# Patient Record
Sex: Female | Born: 1965 | Race: White | Hispanic: No | Marital: Married | Smoking: Former smoker
Health system: Southern US, Community
[De-identification: ages and names within clinical notes are randomized; demographics above are authoritative.]

## PROBLEM LIST (undated history)

## (undated) DIAGNOSIS — M797 Fibromyalgia: Secondary | ICD-10-CM

## (undated) DIAGNOSIS — M4802 Spinal stenosis, cervical region: Secondary | ICD-10-CM

## (undated) HISTORY — PX: BREAST BIOPSY: SHX20

## (undated) HISTORY — PX: OTHER SURGICAL HISTORY: SHX169

## (undated) HISTORY — DX: Fibromyalgia: M79.7

## (undated) HISTORY — DX: Spinal stenosis, cervical region: M48.02

---

## 2008-09-24 ENCOUNTER — Ambulatory Visit: Payer: Self-pay | Admitting: Internal Medicine

## 2008-11-21 ENCOUNTER — Ambulatory Visit: Payer: Self-pay | Admitting: Obstetrics and Gynecology

## 2008-11-23 ENCOUNTER — Ambulatory Visit: Payer: Self-pay | Admitting: Obstetrics and Gynecology

## 2010-08-15 ENCOUNTER — Ambulatory Visit: Payer: Self-pay | Admitting: Unknown Physician Specialty

## 2011-04-03 ENCOUNTER — Ambulatory Visit: Payer: Self-pay | Admitting: Obstetrics and Gynecology

## 2011-06-04 ENCOUNTER — Ambulatory Visit: Payer: Self-pay | Admitting: Internal Medicine

## 2011-06-05 ENCOUNTER — Emergency Department: Payer: Self-pay | Admitting: Emergency Medicine

## 2011-06-05 LAB — BASIC METABOLIC PANEL
Anion Gap: 6 — ABNORMAL LOW (ref 7–16)
Calcium, Total: 8.9 mg/dL (ref 8.5–10.1)
Chloride: 104 mmol/L (ref 98–107)
EGFR (African American): >60
Glucose: 121 mg/dL — ABNORMAL HIGH (ref 65–99)
Sodium: 138 mmol/L (ref 136–145)

## 2011-06-05 LAB — CBC
MCH: 31.9 pg (ref 26.0–34.0)
MCHC: 33.9 g/dL (ref 32.0–36.0)
Platelet: 198 10*3/uL (ref 150–440)
RDW: 13.1 % (ref 11.5–14.5)
WBC: 16 10*3/uL — ABNORMAL HIGH (ref 3.6–11.0)

## 2011-06-05 LAB — TROPONIN I: Troponin-I: <0.02 ng/mL

## 2011-06-26 ENCOUNTER — Ambulatory Visit (INDEPENDENT_AMBULATORY_CARE_PROVIDER_SITE_OTHER): Admitting: Internal Medicine

## 2011-06-26 ENCOUNTER — Encounter: Payer: Self-pay | Admitting: Internal Medicine

## 2011-06-26 VITALS — BP 106/60 | HR 80 | Temp 98.0°F | Resp 16 | Ht 66.5 in | Wt 138.8 lb

## 2011-06-26 DIAGNOSIS — K2990 Gastroduodenitis, unspecified, without bleeding: Secondary | ICD-10-CM

## 2011-06-26 DIAGNOSIS — R5383 Other fatigue: Secondary | ICD-10-CM

## 2011-06-26 DIAGNOSIS — R5381 Other malaise: Secondary | ICD-10-CM

## 2011-06-26 DIAGNOSIS — M4802 Spinal stenosis, cervical region: Secondary | ICD-10-CM

## 2011-06-26 DIAGNOSIS — K2970 Gastritis, unspecified, without bleeding: Secondary | ICD-10-CM

## 2011-06-26 DIAGNOSIS — N393 Stress incontinence (female) (male): Secondary | ICD-10-CM

## 2011-06-26 DIAGNOSIS — M546 Pain in thoracic spine: Secondary | ICD-10-CM

## 2011-06-26 MED ORDER — OMEPRAZOLE 40 MG PO CPDR: 40.0000 mg | DELAYED_RELEASE_CAPSULE | Freq: Every day | ORAL | Status: DC

## 2011-06-26 MED ORDER — TRAMADOL HCL 50 MG PO TABS: 50.0000 mg | ORAL_TABLET | Freq: Three times a day (TID) | ORAL | Status: AC | PRN

## 2011-06-26 MED ORDER — MELOXICAM 15 MG PO TABS: 15.0000 mg | ORAL_TABLET | Freq: Every day | ORAL | Status: AC

## 2011-06-26 NOTE — Progress Notes (Signed)
Patient ID: Debbie Lawrence, female   DOB: March 26, 1965, 46 y.o.   MRN: [id number]  Patient Active Problem List  Diagnoses  . Degenerative cervical spinal stenosis  . Back pain, thoracic  . Stress incontinence  . Fatigue  . Gastritis    Subjective:  CC:   Chief Complaint  Patient presents with  . New Patient    HPI:   Debbie Lawrence a 46 y.o. female who presents  As a new patient here to establish primary care.  She was referred by several friends.  She has a recent history of pneumonia, treated in the ER 2 weeks ago.  History of fibroid adenoma of the right breast which was biopsied after ultrasound in  2008. Found by self exam.   Sister has history of breast cancer attributed to use of oral contraceptives because it was ER positive and underwent, lumpectomy and LN removed.  Last mammogram at [facility] April 2013 at [facility] and was normal.  Her last PAP smear was  April 2011.  No history of abnormals.  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  She has been self referred to a pain specialist.  Her pain is on the right, accompanied by numbness and tingling under the shoulder blades, worse in the AM and at the end of the day . Has not had a thoarcic  MRi but has had a neck MRI.  She has been using NSAIDs for control of pain but has developed  Gastritis symptoms with persistent use of alleve or more than 3 ibuprofen .   Past Medical History  Diagnosis Date  . Degenerative cervical spinal stenosis     occupational, hari dresser    No past surgical history on file.       The following portions of the patient's history were reviewed and updated as appropriate: Allergies, current medications, and problem list.    Review of Systems:   12 Pt  review of systems was negative except those addressed in the HPI,     History   Social History  . Marital Status: Married    Spouse Name: N/A    Number of Children: N/A  . Years of  Education: N/A   Occupational History  . Not on file.   Social History Main Topics  . Smoking status: Former Smoker -- 15 years    Types: Cigarettes    Quit date: 06/12/2011  . Smokeless tobacco: Never Used  . Alcohol Use: No  . Drug Use: No  . Sexually Active: Not on file   Other Topics Concern  . Not on file   Social History Narrative  . No narrative on file    Objective:  BP 106/60  Pulse 80  Temp(Src) 98 F (36.7 C) (Oral)  Resp 16  Ht 5' 6.5" (1.689 m)  Wt 138 lb 12 oz (62.937 kg)  BMI 22.06 kg/m2  SpO2 97%  LMP 06/10/2011  General appearance: alert, cooperative and appears stated age Ears: normal TM's and external ear canals both ears Throat: lips, mucosa, and tongue normal; teeth and gums normal Neck: no adenopathy, no carotid bruit, supple, symmetrical, trachea midline and thyroid not enlarged, symmetric, no tenderness/mass/nodules Back: symmetric, no curvature. ROM normal. No CVA tenderness. Lungs: clear to auscultation bilaterally Heart: regular rate and rhythm, S1, S2 normal, no murmur, click, rub or gallop Abdomen: soft, non-tender; bowel sounds normal; no masses,  no organomegaly Pulses: 2+ and symmetric Skin: Skin color,  texture, turgor normal. No rashes or lesions Lymph nodes: Cervical, supraclavicular, and axillary nodes normal.  Assessment and Plan:  Stress incontinence Kegel exercises.   Back pain, thoracic Her symptoms suggest a hight thoracic disk problem.  She has developed gastritis to NSAIDs.  MRI thoaracic spinen ordered and tramadol trial.   Degenerative cervical spinal stenosis By history with no prior surgical intervention Records requested. neurologic exam is thus far normal.  Fatigue Anemia and thyroid  ruled out by gyn several  months ago,  Has chronic pain, no history of snoring or hypertension to suggest OSA.     Gastritis Suggested by history,  Suspending all NSAIDs for 2 weeks. PPI daily.  Trial of meloxicam after 2  weeks.,  If sympotms return will need to dc all NSAIDS and send for EGD    Updated Medication List Outpatient Encounter Prescriptions as of 06/26/2011  Medication Sig Dispense Refill  . Ascorbic Acid (VITAMIN C) 100 MG tablet Take 100 mg by mouth daily.      Marland Kitchen CRANBERRY EXTRACT PO Take by mouth.      Marland Kitchen FLUoxetine (PROZAC) 10 MG capsule Take 10 mg by mouth daily.      . Prenatal Vit-Fe Fumarate-FA (PRENATAL COMPLETE PO) Take by mouth.      . meloxicam (MOBIC) 15 MG tablet Take 1 tablet (15 mg total) by mouth daily.  30 tablet  2  . omeprazole (PRILOSEC) 40 MG capsule Take 1 capsule (40 mg total) by mouth daily.  30 capsule  3  . traMADol (ULTRAM) 50 MG tablet Take 1 tablet (50 mg total) by mouth every 8 (eight) hours as needed for pain.  30 tablet  1     Orders Placed This Encounter  Procedures  . MR Thoracic Spine Wo Contrast    No Follow-up on file.

## 2011-06-26 NOTE — Assessment & Plan Note (Signed)
Kegel exercises

## 2011-06-26 NOTE — Patient Instructions (Signed)
Suspend all ibuprofen for two weeks while we let your stomach heal, use the tramadol every 6 hours as needed for pain   Take omeprazole 40 mg daily for your stomach.   Start meloxicam in 2 weeks ,  One tablet daily (this replaces ibuprofen as your anti infllamatory)

## 2011-06-30 ENCOUNTER — Encounter: Payer: Self-pay | Admitting: Internal Medicine

## 2011-06-30 DIAGNOSIS — K589 Irritable bowel syndrome without diarrhea: Secondary | ICD-10-CM | POA: Insufficient documentation

## 2011-06-30 NOTE — Assessment & Plan Note (Signed)
By history with no prior surgical intervention Records requested. neurologic exam is thus far normal.

## 2011-06-30 NOTE — Assessment & Plan Note (Signed)
Her symptoms suggest a hight thoracic disk problem.  She has developed gastritis to NSAIDs.  MRI thoaracic spinen ordered and tramadol trial.

## 2011-06-30 NOTE — Assessment & Plan Note (Signed)
Suggested by history,  Suspending all NSAIDs for 2 weeks. PPI daily.  Trial of meloxicam after 2 weeks.,  If sympotms return will need to dc all NSAIDS and send for EGD

## 2011-06-30 NOTE — Assessment & Plan Note (Signed)
Anemia and thyroid  ruled out by gyn several  months ago,  Has chronic pain, no history of snoring or hypertension to suggest OSA.

## 2011-07-01 ENCOUNTER — Ambulatory Visit: Payer: Self-pay | Admitting: Internal Medicine

## 2011-07-05 ENCOUNTER — Telehealth: Payer: Self-pay | Admitting: Internal Medicine

## 2011-07-05 DIAGNOSIS — R160 Hepatomegaly, not elsewhere classified: Secondary | ICD-10-CM | POA: Insufficient documentation

## 2011-07-05 DIAGNOSIS — M546 Pain in thoracic spine: Secondary | ICD-10-CM

## 2011-07-05 NOTE — Telephone Encounter (Signed)
Patient notified of the MRI results.

## 2011-07-05 NOTE — Telephone Encounter (Signed)
Her thoracic spine MRI showed some mild disk disease at multiple levels at T5 -T6 and  T6-T7 which may respond to PT, so I recommend a trial of PT.  The other issue is an incidental finding of a 1.5 cm mass on her liver, and the radiologist is recommending a dedicated MRI to look at it more closely.  I will place the order.

## 2011-07-05 NOTE — Telephone Encounter (Signed)
Left message asking patient to return my call.

## 2011-07-05 NOTE — Assessment & Plan Note (Signed)
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  Will refer to PT

## 2011-07-05 NOTE — Assessment & Plan Note (Signed)
Incidental finding on MRI of thoracic spine, MRI ordered.

## 2011-07-09 ENCOUNTER — Encounter: Payer: Self-pay | Admitting: Internal Medicine

## 2011-07-10 ENCOUNTER — Encounter: Payer: Self-pay | Admitting: Internal Medicine

## 2011-07-11 ENCOUNTER — Telehealth: Payer: Self-pay | Admitting: Internal Medicine

## 2011-07-11 NOTE — Telephone Encounter (Signed)
Pt called to F/U on MRI order that was request from pt on 07/05/11, Please call pt back.

## 2011-07-12 NOTE — Telephone Encounter (Signed)
I have called patient back and see that she does need a MRI of the Abdomen.  I told patient that I would make appointment and get back with her.

## 2011-07-15 ENCOUNTER — Encounter: Payer: Self-pay | Admitting: Internal Medicine

## 2011-07-15 NOTE — Telephone Encounter (Signed)
Patient is scheduled for this on 07/16/11  At 12:30, patient is aware.see referral notes.

## 2011-07-22 ENCOUNTER — Ambulatory Visit: Payer: Self-pay | Admitting: Internal Medicine

## 2011-07-22 ENCOUNTER — Encounter: Payer: Self-pay | Admitting: Internal Medicine

## 2011-07-23 ENCOUNTER — Telehealth: Payer: Self-pay | Admitting: Internal Medicine

## 2011-07-30 ENCOUNTER — Ambulatory Visit: Payer: Self-pay | Admitting: Pain Medicine

## 2011-08-06 ENCOUNTER — Ambulatory Visit: Payer: Self-pay | Admitting: Pain Medicine

## 2011-08-12 ENCOUNTER — Encounter: Payer: Self-pay | Admitting: Internal Medicine

## 2011-08-12 NOTE — Telephone Encounter (Signed)
Opened in error

## 2011-08-20 ENCOUNTER — Telehealth: Payer: Self-pay | Admitting: Internal Medicine

## 2011-08-20 NOTE — Telephone Encounter (Signed)
Caller: Debbie Lawrence/Patient; PCP: Duncan Dull; CB#: [phone]. Call regarding ?sinus Infection; Caller reports cold sxs that began appx 2 weeks ago. Lingering and she now has facial pain and pressure, pain in her left ear, headache and coughing with yellow/greenish mucous. Same from nose. Afebrile. Caller asking if she can get Rx or does she need to be seen. Caller advised she will need to be seen for eval of sxs. Home care per Face Pain or Pressure Protocol, See in 24hrs. Appt scheduled for Wed 7/31 at 9am with Dr Darrick Huntsman. Caller is agreeable and will use home care until she can be seen.

## 2011-08-21 ENCOUNTER — Ambulatory Visit: Admitting: Internal Medicine

## 2011-08-22 ENCOUNTER — Encounter: Payer: Self-pay | Admitting: Internal Medicine

## 2011-08-28 ENCOUNTER — Ambulatory Visit: Payer: Self-pay | Admitting: Pain Medicine

## 2011-09-19 ENCOUNTER — Ambulatory Visit: Payer: Self-pay | Admitting: Pain Medicine

## 2011-10-03 ENCOUNTER — Ambulatory Visit: Payer: Self-pay | Admitting: Pain Medicine

## 2011-10-23 ENCOUNTER — Ambulatory Visit: Payer: Self-pay | Admitting: Pain Medicine

## 2011-10-29 ENCOUNTER — Ambulatory Visit: Payer: Self-pay | Admitting: Pain Medicine

## 2011-11-13 ENCOUNTER — Ambulatory Visit: Payer: Self-pay | Admitting: Pain Medicine

## 2012-02-06 ENCOUNTER — Other Ambulatory Visit: Payer: Self-pay | Admitting: Internal Medicine

## 2012-02-06 ENCOUNTER — Encounter: Payer: Self-pay | Admitting: Internal Medicine

## 2012-02-06 MED ORDER — TRAMADOL HCL 50 MG PO TABS: 50.0000 mg | ORAL_TABLET | Freq: Three times a day (TID) | ORAL | Status: DC | PRN

## 2012-02-06 NOTE — Telephone Encounter (Signed)
We no longer take phone messages about Rx refills on the voicemail. Please advise.

## 2012-03-07 ENCOUNTER — Other Ambulatory Visit: Payer: Self-pay

## 2012-04-23 ENCOUNTER — Other Ambulatory Visit: Payer: Self-pay | Admitting: Internal Medicine

## 2012-04-24 ENCOUNTER — Telehealth: Payer: Self-pay | Admitting: *Deleted

## 2012-04-24 ENCOUNTER — Encounter: Payer: Self-pay | Admitting: Internal Medicine

## 2012-04-24 NOTE — Telephone Encounter (Addendum)
Patient is calling to see if Dr. Darrick Huntsman is going to fill her prozac. Advised patient to get refill from prescribing physician until she sees Dr. Darrick Huntsman and then she can discuss with her. Per Sarajane Marek.

## 2012-04-25 ENCOUNTER — Other Ambulatory Visit: Payer: Self-pay | Admitting: Internal Medicine

## 2012-04-25 DIAGNOSIS — F329 Major depressive disorder, single episode, unspecified: Secondary | ICD-10-CM

## 2012-04-25 MED ORDER — FLUOXETINE HCL 10 MG PO CAPS: ORAL_CAPSULE | ORAL | Status: DC

## 2012-05-13 ENCOUNTER — Encounter: Admitting: Internal Medicine

## 2012-06-17 ENCOUNTER — Encounter: Payer: Self-pay | Admitting: Internal Medicine

## 2012-09-02 ENCOUNTER — Encounter: Payer: Self-pay | Admitting: Adult Health

## 2012-09-02 ENCOUNTER — Ambulatory Visit (INDEPENDENT_AMBULATORY_CARE_PROVIDER_SITE_OTHER): Admitting: Adult Health

## 2012-09-02 VITALS — BP 102/62 | HR 72 | Temp 98.4°F | Resp 12 | Wt 141.0 lb

## 2012-09-02 DIAGNOSIS — H5789 Other specified disorders of eye and adnexa: Secondary | ICD-10-CM | POA: Insufficient documentation

## 2012-09-02 NOTE — Progress Notes (Signed)
  Subjective:    Patient ID: Debbie Lawrence, female    DOB: 1965-02-20, 47 y.o.   MRN: [id number]  HPI  Patient is a pleasant 47 year old female who presents to clinic with a small growth on the outer corner of left eye. She reports that she had similar findings approximately 4 months ago. This lasted for about 3 weeks and subsided on its own. Now this new area has been ongoing for approximately 3 months. It is uncomfortable. There is no drainage from the site. There is no visual disturbances. She has applied warm compresses to the area.  Current Outpatient Prescriptions on File Prior to Visit  Medication Sig Dispense Refill  . Ascorbic Acid (VITAMIN C) 100 MG tablet Take 100 mg by mouth daily.      Marland Kitchen CRANBERRY EXTRACT PO Take by mouth.      Marland Kitchen FLUoxetine (PROZAC) 10 MG capsule TAKE 1 CAPSULE BY MOUTH EVERY MORNING  90 capsule  1  . omeprazole (PRILOSEC) 40 MG capsule Take 1 capsule (40 mg total) by mouth daily.  30 capsule  3  . Prenatal Vit-Fe Fumarate-FA (PRENATAL COMPLETE PO) Take by mouth.      . traMADol (ULTRAM) 50 MG tablet Take 1 tablet (50 mg total) by mouth every 8 (eight) hours as needed for pain.  90 tablet  3   No current facility-administered medications on file prior to visit.     Review of Systems  Constitutional: Negative for fever.  Eyes:       Growth on outer aspect of left eye. Painful to touch. No drainage. No visual disturbance.       Objective:   Physical Exam  Constitutional: She is oriented to person, place, and time.  Eyes: Conjunctivae and EOM are normal. Pupils are equal, round, and reactive to light.  Cyst on outer aspect of left eye. It is hard to the touch.  Neurological: She is alert and oriented to person, place, and time.  Psychiatric: She has a normal mood and affect. Her behavior is normal. Judgment and thought content normal.          Assessment & Plan:

## 2012-09-02 NOTE — Assessment & Plan Note (Signed)
The area is uncomfortable. Give closeness to eye, will refer to Ophthalmology.

## 2012-10-15 ENCOUNTER — Other Ambulatory Visit (HOSPITAL_COMMUNITY)
Admission: RE | Admit: 2012-10-15 | Discharge: 2012-10-15 | Disposition: A | Discharge status: Home / Self Care | Source: Ambulatory Visit | Attending: Internal Medicine | Admitting: Internal Medicine

## 2012-10-15 ENCOUNTER — Ambulatory Visit (INDEPENDENT_AMBULATORY_CARE_PROVIDER_SITE_OTHER): Admitting: Internal Medicine

## 2012-10-15 ENCOUNTER — Encounter: Payer: Self-pay | Admitting: *Deleted

## 2012-10-15 ENCOUNTER — Encounter: Payer: Self-pay | Admitting: Internal Medicine

## 2012-10-15 VITALS — BP 100/64 | HR 72 | Temp 98.7°F | Resp 12 | Ht 66.5 in | Wt 142.2 lb

## 2012-10-15 DIAGNOSIS — F329 Major depressive disorder, single episode, unspecified: Secondary | ICD-10-CM

## 2012-10-15 DIAGNOSIS — Z1239 Encounter for other screening for malignant neoplasm of breast: Secondary | ICD-10-CM

## 2012-10-15 DIAGNOSIS — Z01419 Encounter for gynecological examination (general) (routine) without abnormal findings: Secondary | ICD-10-CM | POA: Insufficient documentation

## 2012-10-15 DIAGNOSIS — M4802 Spinal stenosis, cervical region: Secondary | ICD-10-CM

## 2012-10-15 DIAGNOSIS — K769 Liver disease, unspecified: Secondary | ICD-10-CM

## 2012-10-15 DIAGNOSIS — Z124 Encounter for screening for malignant neoplasm of cervix: Secondary | ICD-10-CM

## 2012-10-15 DIAGNOSIS — Z0000 Encounter for general adult medical examination without abnormal findings: Secondary | ICD-10-CM

## 2012-10-15 DIAGNOSIS — Z1151 Encounter for screening for human papillomavirus (HPV): Secondary | ICD-10-CM | POA: Insufficient documentation

## 2012-10-15 DIAGNOSIS — R160 Hepatomegaly, not elsewhere classified: Secondary | ICD-10-CM

## 2012-10-15 DIAGNOSIS — M546 Pain in thoracic spine: Secondary | ICD-10-CM

## 2012-10-15 MED ORDER — FLUOXETINE HCL 10 MG PO CAPS: ORAL_CAPSULE | ORAL | Status: DC

## 2012-10-15 MED ORDER — TRAMADOL HCL 50 MG PO TABS: 50.0000 mg | ORAL_TABLET | Freq: Three times a day (TID) | ORAL | Status: DC | PRN

## 2012-10-15 NOTE — Patient Instructions (Addendum)
You have a viral  Syndrome .  The post nasal drip is causing your sore throat.  Lavage your sinuses twice daily with Simply saline nasal spray. Use the Nasocrt spray daily for the rest of the season.  Wean yourself off of afrin spray over the next few weeks.   Use benadryl 25 mg every 8 hours and Sudafed PE 10 to 30 every 8 hours to manage the drainage and congestion.  Gargle with salt water often for the sore throat.  If the throat is no better  In 3 to 4 days OR  if you develop T > 100.4,  Green nasal discharge,  Or facial pain,  Call for an antibiotic.  Mammogram to be ordered   Return for fasting labs at your earliest convenience  I DO recommend the flu vaccine for you when you are feeling better

## 2012-10-15 NOTE — Progress Notes (Signed)
Patient ID: LOVINIA SNARE, female   DOB: 1965-04-25, 47 y.o.   MRN: [id number]    2Subjective:     Debbie Lawrence is a 47 y.o. female and is here for a comprehensive physical exam. The patient reports Sinus congesrion for one day .    Persistent neck and back pain.   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   Night time hot flashes which will and he is a Lahey he pain her he is a to her and she a for her started at age 4.  Periods still regularly appearing   .Marland Kitchen  History   Social History  . Marital Status: Married    Spouse Name: N/A    Number of Children: N/A  . Years of Education: N/A   Occupational History  . hairdresser    Social History Main Topics  . Smoking status: Former Smoker -- 15 years    Types: Cigarettes    Quit date: 06/12/2011  . Smokeless tobacco: Never Used  . Alcohol Use: No  . Drug Use: No  . Sexual Activity: Yes   Other Topics Concern  . Not on file   Social History Narrative  . No narrative on file   Health Maintenance  Topic Date Due  . Influenza Vaccine  08/21/2012  . Tetanus/tdap  10/15/2012  . Pap Smear  10/16/2015    The following portions of the patient's history were reviewed and updated as appropriate: allergies, current medications, past family history, past medical history, past social history, past surgical history and problem list.  Review of Systems A comprehensive review of systems was negative.   Objective:   BP 100/64  Pulse 72  Temp(Src) 98.7 F (37.1 C) (Oral)  Resp 12  Ht 5' 6.5" (1.689 m)  Wt 142 lb 4 oz (64.524 kg)  BMI 22.62 kg/m2  SpO2 98%  LMP 10/02/2012  General Appearance:    Alert, cooperative, no distress, appears stated age  Head:    Normocephalic, without obvious abnormality, atraumatic  Eyes:    PERRL, conjunctiva/corneas clear, EOM's intact, fundi    benign, both eyes  Ears:    Normal TM's  and external ear canals, both ears  Nose:   Nares normal, septum midline, mucosa normal, no drainage    or sinus tenderness  Throat:   Lips, mucosa, and tongue normal; teeth and gums normal  Neck:   Supple, symmetrical, trachea midline, no adenopathy;    thyroid:  no enlargement/tenderness/nodules; no carotid   bruit or JVD  Back:     Symmetric, no curvature, ROM normal, no CVA tenderness  Lungs:     Clear to auscultation bilaterally, respirations unlabored  Chest Wall:    No tenderness or deformity   Heart:    Regular rate and rhythm, S1 and S2 normal, no murmur, rub   or gallop  Breast Exam:    No tenderness, masses, or nipple abnormality  Abdomen:     Soft, non-tender, bowel sounds active all four quadrants,    no masses, no organomegaly  Genitalia:    Pelvic: cervix normal in appearance, external genitalia normal, no adnexal masses or tenderness, no cervical motion tenderness, rectovaginal septum normal, uterus normal size, shape, and consistency and vagina normal without discharge  Extremities:   Extremities normal, atraumatic, no cyanosis or edema  Pulses:   2+ and symmetric all extremities  Skin:  Skin color, texture, turgor normal, no rashes or lesions  Lymph nodes:   Cervical, supraclavicular, and axillary nodes normal  Neurologic:   CNII-XII intact, normal strength, sensation and reflexes    throughout    Assessment:   Routine general medical examination at a health care facility Annual comprehensive exam was done including breast, pelvic and PAP smear. All screenings have been addressed .   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  Back pain, thoracic Secondary to spondylosis. No neural foramen impingement. Continue tramadol and physical  therapy   Updated Medication List Outpatient Encounter Prescriptions as of 10/15/2012  Medication Sig Dispense Refill  . Ascorbic Acid (VITAMIN C) 100 MG tablet Take 100 mg by mouth daily.      Marland Kitchen CRANBERRY EXTRACT PO Take by mouth.      Marland Kitchen FLUoxetine (PROZAC) 10 MG capsule TAKE 1 CAPSULE BY MOUTH EVERY MORNING  90 capsule  1  . Prenatal Vit-Fe Fumarate-FA (PRENATAL COMPLETE PO) Take by mouth.      . traMADol (ULTRAM) 50 MG tablet Take 1 tablet (50 mg total) by mouth every 8 (eight) hours as needed for pain.  90 tablet  3  . [DISCONTINUED] FLUoxetine (PROZAC) 10 MG capsule TAKE 1 CAPSULE BY MOUTH EVERY MORNING  90 capsule  1  . [DISCONTINUED] traMADol (ULTRAM) 50 MG tablet Take 1 tablet (50 mg total) by mouth every 8 (eight) hours as needed for pain.  90 tablet  3  . omeprazole (PRILOSEC) 40 MG capsule Take 1 capsule (40 mg total) by mouth daily.  30 capsule  3   No facility-administered encounter medications on file as of 10/15/2012.

## 2012-10-17 ENCOUNTER — Encounter: Payer: Self-pay | Admitting: Internal Medicine

## 2012-10-17 DIAGNOSIS — Z0000 Encounter for general adult medical examination without abnormal findings: Secondary | ICD-10-CM | POA: Insufficient documentation

## 2012-10-17 NOTE — Assessment & Plan Note (Signed)
Annual comprehensive exam was done including breast, pelvic and PAP smear. All screenings have been addressed .  

## 2012-10-17 NOTE — Assessment & Plan Note (Signed)
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

## 2012-10-17 NOTE — Assessment & Plan Note (Signed)
Secondary to spondylosis. No neural foramen impingement. Continue tramadol and physical therapy

## 2012-10-17 NOTE — Assessment & Plan Note (Signed)
Felt to be a hemangioma by MRI of the abdomen.

## 2012-10-19 ENCOUNTER — Encounter: Payer: Self-pay | Admitting: Internal Medicine

## 2012-11-09 ENCOUNTER — Encounter: Payer: Self-pay | Admitting: Internal Medicine

## 2012-11-26 ENCOUNTER — Other Ambulatory Visit: Payer: Self-pay

## 2013-01-21 HISTORY — PX: BREAST BIOPSY: SHX20

## 2013-02-04 ENCOUNTER — Telehealth: Payer: Self-pay | Admitting: Internal Medicine

## 2013-02-04 DIAGNOSIS — R5383 Other fatigue: Secondary | ICD-10-CM

## 2013-02-04 DIAGNOSIS — R5381 Other malaise: Secondary | ICD-10-CM

## 2013-02-04 DIAGNOSIS — E559 Vitamin D deficiency, unspecified: Secondary | ICD-10-CM

## 2013-02-04 DIAGNOSIS — E785 Hyperlipidemia, unspecified: Secondary | ICD-10-CM

## 2013-02-04 NOTE — Telephone Encounter (Signed)
Please advise of labs needed.

## 2013-02-04 NOTE — Telephone Encounter (Signed)
Fasting labs ordered

## 2013-02-04 NOTE — Telephone Encounter (Signed)
Pt having thyroid symptoms.  Would like thyroid checked and vitamin D along with any other lab work Dr. Derrel Nip would like to get.  Appt 1/19.  No orders in.

## 2013-02-08 ENCOUNTER — Other Ambulatory Visit (INDEPENDENT_AMBULATORY_CARE_PROVIDER_SITE_OTHER)

## 2013-02-08 DIAGNOSIS — E785 Hyperlipidemia, unspecified: Secondary | ICD-10-CM

## 2013-02-08 DIAGNOSIS — R5381 Other malaise: Secondary | ICD-10-CM

## 2013-02-08 DIAGNOSIS — E559 Vitamin D deficiency, unspecified: Secondary | ICD-10-CM

## 2013-02-08 DIAGNOSIS — R5383 Other fatigue: Principal | ICD-10-CM

## 2013-02-08 LAB — CBC WITH DIFFERENTIAL/PLATELET
Basophils Absolute: 0 10*3/uL (ref 0.0–0.1)
Basophils Relative: 0.4 % (ref 0.0–3.0)
EOS ABS: 0.1 10*3/uL (ref 0.0–0.7)
Eosinophils Relative: 1.5 % (ref 0.0–5.0)
HCT: 37.2 % (ref 36.0–46.0)
Hemoglobin: 12.6 g/dL (ref 12.0–15.0)
LYMPHS PCT: 31.4 % (ref 12.0–46.0)
Lymphs Abs: 2.1 10*3/uL (ref 0.7–4.0)
MCHC: 33.9 g/dL (ref 30.0–36.0)
MCV: 92.4 fl (ref 78.0–100.0)
Monocytes Absolute: 0.4 10*3/uL (ref 0.1–1.0)
Monocytes Relative: 5.7 % (ref 3.0–12.0)
NEUTROS PCT: 61 % (ref 43.0–77.0)
Neutro Abs: 4.1 10*3/uL (ref 1.4–7.7)
Platelets: 243 10*3/uL (ref 150.0–400.0)
RBC: 4.03 Mil/uL (ref 3.87–5.11)
RDW: 13.5 % (ref 11.5–14.6)
WBC: 6.8 10*3/uL (ref 4.5–10.5)

## 2013-02-08 LAB — COMPREHENSIVE METABOLIC PANEL
ALK PHOS: 71 U/L (ref 39–117)
ALT: 14 U/L (ref 0–35)
AST: 20 U/L (ref 0–37)
Albumin: 4.2 g/dL (ref 3.5–5.2)
BILIRUBIN TOTAL: 0.7 mg/dL (ref 0.3–1.2)
BUN: 15 mg/dL (ref 6–23)
CO2: 28 meq/L (ref 19–32)
CREATININE: 0.7 mg/dL (ref 0.4–1.2)
Calcium: 9.1 mg/dL (ref 8.4–10.5)
Chloride: 104 mEq/L (ref 96–112)
GFR: 100.57 mL/min (ref >60.00)
Glucose, Bld: 88 mg/dL (ref 70–99)
Potassium: 3.8 mEq/L (ref 3.5–5.1)
Sodium: 139 mEq/L (ref 135–145)
Total Protein: 7.5 g/dL (ref 6.0–8.3)

## 2013-02-08 LAB — LIPID PANEL
CHOLESTEROL: 138 mg/dL (ref 0–200)
HDL: 46.6 mg/dL (ref >39.00)
LDL Cholesterol: 63 mg/dL (ref 0–99)
Total CHOL/HDL Ratio: 3
Triglycerides: 144 mg/dL (ref 0.0–149.0)
VLDL: 28.8 mg/dL (ref 0.0–40.0)

## 2013-02-08 LAB — TSH: TSH: 1.86 u[IU]/mL (ref 0.35–5.50)

## 2013-02-09 ENCOUNTER — Encounter: Payer: Self-pay | Admitting: Internal Medicine

## 2013-02-09 LAB — VITAMIN D 25 HYDROXY (VIT D DEFICIENCY, FRACTURES): VIT D 25 HYDROXY: 42 ng/mL (ref 30–89)

## 2013-02-10 ENCOUNTER — Encounter: Payer: Self-pay | Admitting: Internal Medicine

## 2013-02-11 ENCOUNTER — Telehealth: Payer: Self-pay | Admitting: Internal Medicine

## 2013-02-11 NOTE — Telephone Encounter (Signed)
Pt would like a call to discuss her thyroid lab results.  States she received mychart msg that her other labs were fine but there was no mention of thyroid lab.  Pt states she cannot see her results on mychart.

## 2013-02-11 NOTE — Telephone Encounter (Signed)
Patient notified of results.

## 2013-11-03 ENCOUNTER — Ambulatory Visit: Payer: Self-pay | Admitting: Obstetrics and Gynecology

## 2013-11-05 ENCOUNTER — Other Ambulatory Visit: Payer: Self-pay

## 2013-11-12 ENCOUNTER — Ambulatory Visit: Payer: Self-pay | Admitting: Obstetrics and Gynecology

## 2013-11-15 ENCOUNTER — Ambulatory Visit: Payer: Self-pay | Admitting: Obstetrics and Gynecology

## 2013-11-15 LAB — HEMOGLOBIN: HGB: 11.7 g/dL — ABNORMAL LOW (ref 12.0–16.0)

## 2013-11-19 ENCOUNTER — Ambulatory Visit: Payer: Self-pay | Admitting: Obstetrics and Gynecology

## 2013-11-22 ENCOUNTER — Telehealth: Payer: Self-pay | Admitting: Internal Medicine

## 2013-11-22 DIAGNOSIS — R928 Other abnormal and inconclusive findings on diagnostic imaging of breast: Secondary | ICD-10-CM

## 2013-11-22 NOTE — Telephone Encounter (Signed)
Left message for pt to return my call.

## 2013-11-22 NOTE — Telephone Encounter (Signed)
[facility] has reported that Her mammogram was abnormal on the left .   She will be contacted to get additional films and an ultrasound the facility. If she has not heard from them yet, let us know

## 2013-11-23 NOTE — Telephone Encounter (Signed)
Patient stated additional images made at [facility] and they have setup at Stereotactic biopsy for next week .

## 2013-12-13 ENCOUNTER — Telehealth: Payer: Self-pay | Admitting: Internal Medicine

## 2013-12-13 DIAGNOSIS — R921 Mammographic calcification found on diagnostic imaging of breast: Secondary | ICD-10-CM | POA: Insufficient documentation

## 2013-12-29 ENCOUNTER — Encounter: Payer: Self-pay | Admitting: Internal Medicine

## 2013-12-30 ENCOUNTER — Encounter: Payer: Self-pay | Admitting: Internal Medicine

## 2014-01-13 ENCOUNTER — Encounter: Payer: Self-pay | Admitting: Internal Medicine

## 2014-02-08 ENCOUNTER — Encounter: Payer: Self-pay | Admitting: Internal Medicine

## 2014-05-14 NOTE — Op Note (Signed)
PATIENT NAME:  Debbie Lawrence, BOHNSACK MR#:  [id number] DATE OF BIRTH:  1965-05-30  DATE OF PROCEDURE:  11/19/2013  PREOPERATIVE DIAGNOSES:  1.  Abnormal uterine bleeding. 2.  Dysmenorrhea.  POSTOPERATIVE DIAGNOSES:  1.  Abnormal uterine bleeding. 2.  Dysmenorrhea. 3.  Endometrial polyp.  PROCEDURES PERFORMED: 1.  Hysteroscopy. 2.  Dilatation and curettage. 3.  NovaSure endometrial ablation.  SURGEON: Nathaneil Canary, MD  TYPE OF ANESTHESIA: General.  COMPLICATIONS: None.  ESTIMATED BLOOD LOSS: 25 mL.  FLUIDS: 1 liter lactated Ringer's.  URINE OUTPUT: 50 mL at the beginning of the case.  INDICATIONS: Conservative management of abnormal uterine bleeding.  FINDINGS: Uterus sounded to 9 cm. Cervical length 4.5 cm. Cavity width 3 cm. Benign proliferative endometrium noted with a small polyp in the left cornua.   DESCRIPTION OF PROCEDURE: After obtaining appropriate consent, the patient was taken to the operating room where she was placed under general anesthesia with LMA in place. She was then prepped and draped in the normal sterile fashion, in the dorsal supine position, in candy-cane stirrups. A timeout was taken to ensure the correct patient, the correct procedure, allergies were identified and prophylactic antibiotics were not necessary.  The bladder was drained using a red rubber catheter. The cervix was identified and grasped with a single-tooth tenaculum. The uterus was sounded to 9 cm. Cervical length was measured to be 4.5 cm. The cervix was then sequentially dilated using Pratt dilators up to a 20-French size. Next, the 7 mm hysteroscope was then used to perform hysteroscopy. The polyp was identified, as noted above. Sharp curettage was taken place with a serrated curette and a polyp forceps was used to remove the polyp. The cavity was then re-examined and there was found to be residual polypoid tissue so this was removed with further curettage.   The NovaSure device was opened and  placed into the patient on the appropriate settings. The vacuum test was performed and did not pass at first. A second tenaculum was then used on the posterior aspect of the cervix and with this additional help the cavity assessment was sufficient. The device then ran at a power of 74 for 1 minute and 46 seconds. The patient tolerated the procedure well. The NovaSure device was removed from the patient, as well as all the instruments. A small amount of bleeding from the cervix was controlled with silver nitrate and pressure for approximately 1 minute. There was no bleeding noted at the end of the case. The patient was awoken from anesthesia without complication and transferred to the PAC-U.   Sponge, lap and needle counts were correct x2. The patient will be discharged directly from the PAC-U and will follow up in 2 weeks.   ____________________________ Youlanda Roys. Archie Balboa, MD :sb D: 11/19/2013 11:08:00 ET T: 11/19/2013 12:25:41 ET JOB#: [id number]  cc: Youlanda Roys. Archie Balboa, MD, <Dictator> Jamesmichael Shadd Lilyan Gilford MD ELECTRONICALLY SIGNED 11/19/2013 13:48

## 2014-05-16 LAB — SURGICAL PATHOLOGY

## 2014-06-28 ENCOUNTER — Encounter: Payer: Self-pay | Admitting: Internal Medicine

## 2014-06-29 ENCOUNTER — Encounter: Payer: Self-pay | Admitting: Internal Medicine

## 2014-06-29 ENCOUNTER — Ambulatory Visit (INDEPENDENT_AMBULATORY_CARE_PROVIDER_SITE_OTHER): Admitting: Internal Medicine

## 2014-06-29 VITALS — BP 108/70 | HR 75 | Temp 98.8°F | Resp 14 | Ht 66.0 in | Wt 145.5 lb

## 2014-06-29 DIAGNOSIS — M4802 Spinal stenosis, cervical region: Secondary | ICD-10-CM

## 2014-06-29 DIAGNOSIS — M5431 Sciatica, right side: Secondary | ICD-10-CM

## 2014-06-29 DIAGNOSIS — G629 Polyneuropathy, unspecified: Secondary | ICD-10-CM | POA: Diagnosis not present

## 2014-06-29 DIAGNOSIS — K589 Irritable bowel syndrome without diarrhea: Secondary | ICD-10-CM

## 2014-06-29 DIAGNOSIS — M5430 Sciatica, unspecified side: Secondary | ICD-10-CM | POA: Diagnosis not present

## 2014-06-29 DIAGNOSIS — R921 Mammographic calcification found on diagnostic imaging of breast: Secondary | ICD-10-CM

## 2014-06-29 DIAGNOSIS — R5383 Other fatigue: Secondary | ICD-10-CM

## 2014-06-29 DIAGNOSIS — M2550 Pain in unspecified joint: Secondary | ICD-10-CM

## 2014-06-29 LAB — COMPREHENSIVE METABOLIC PANEL
ALT: 15 U/L (ref 0–35)
AST: 21 U/L (ref 0–37)
Albumin: 4.5 g/dL (ref 3.5–5.2)
Alkaline Phosphatase: 87 U/L (ref 39–117)
BUN: 16 mg/dL (ref 6–23)
CHLORIDE: 103 meq/L (ref 96–112)
CO2: 27 mEq/L (ref 19–32)
CREATININE: 0.68 mg/dL (ref 0.40–1.20)
Calcium: 9.6 mg/dL (ref 8.4–10.5)
GFR: 98.27 mL/min (ref >60.00)
GLUCOSE: 80 mg/dL (ref 70–99)
Potassium: 4.1 mEq/L (ref 3.5–5.1)
Sodium: 136 mEq/L (ref 135–145)
TOTAL PROTEIN: 7.7 g/dL (ref 6.0–8.3)
Total Bilirubin: 0.6 mg/dL (ref 0.2–1.2)

## 2014-06-29 LAB — CBC WITH DIFFERENTIAL/PLATELET
BASOS ABS: 0.1 10*3/uL (ref 0.0–0.1)
BASOS PCT: 1.1 % (ref 0.0–3.0)
EOS PCT: 0.9 % (ref 0.0–5.0)
Eosinophils Absolute: 0.1 10*3/uL (ref 0.0–0.7)
HEMATOCRIT: 39.7 % (ref 36.0–46.0)
HEMOGLOBIN: 13.4 g/dL (ref 12.0–15.0)
LYMPHS ABS: 2 10*3/uL (ref 0.7–4.0)
Lymphocytes Relative: 31.1 % (ref 12.0–46.0)
MCHC: 33.6 g/dL (ref 30.0–36.0)
MCV: 92.5 fl (ref 78.0–100.0)
MONOS PCT: 9.5 % (ref 3.0–12.0)
Monocytes Absolute: 0.6 10*3/uL (ref 0.1–1.0)
Neutro Abs: 3.7 10*3/uL (ref 1.4–7.7)
Neutrophils Relative %: 57.4 % (ref 43.0–77.0)
Platelets: 223 10*3/uL (ref 150.0–400.0)
RBC: 4.29 Mil/uL (ref 3.87–5.11)
RDW: 13.9 % (ref 11.5–15.5)
WBC: 6.4 10*3/uL (ref 4.0–10.5)

## 2014-06-29 LAB — VITAMIN B12: Vitamin B-12: 355 pg/mL (ref 211–911)

## 2014-06-29 LAB — SEDIMENTATION RATE: Sed Rate: 18 mm/hr (ref 0–22)

## 2014-06-29 LAB — TSH: TSH: 3.24 u[IU]/mL (ref 0.35–4.50)

## 2014-06-29 LAB — HEMOGLOBIN A1C: HEMOGLOBIN A1C: 5.2 % (ref 4.6–6.5)

## 2014-06-29 MED ORDER — DICYCLOMINE HCL 10 MG PO CAPS: 10.0000 mg | ORAL_CAPSULE | Freq: Three times a day (TID) | ORAL | Status: DC

## 2014-06-29 MED ORDER — PREGABALIN 75 MG PO CAPS: 75.0000 mg | ORAL_CAPSULE | Freq: Two times a day (BID) | ORAL | Status: DC

## 2014-06-29 MED ORDER — TRAMADOL HCL 50 MG PO TABS: 50.0000 mg | ORAL_TABLET | Freq: Four times a day (QID) | ORAL | Status: AC | PRN

## 2014-06-29 NOTE — Progress Notes (Signed)
Pre-visit discussion using our clinic review tool. No additional management support is needed unless otherwise documented below in the visit note.  

## 2014-06-29 NOTE — Patient Instructions (Addendum)
Trial  Of lyrica for neuropathic pain.  May be taken up to 3 times daily  Can use tramadol 1/2 tablet and tylenol and motrin  up to 3 times daily  MRI Lumbar spine and rheumatology evaluation in process  Trial of dicyclomine for IBS  Take before meals

## 2014-06-29 NOTE — Progress Notes (Signed)
Subjective:  Patient ID: Debbie Lawrence, female    DOB: 1965/03/03  Age: 49 y.o. MRN: [id number]  CC: The primary encounter diagnosis was Sciatica associated with disorder of lumbar spine, unspecified laterality. Diagnoses of Sciatica, right, Neuropathy, Other fatigue, Joint pain, Calcification of left breast, Degenerative cervical spinal stenosis, and IBS (irritable bowel syndrome) were also pertinent to this visit.  HPI Debbie Lawrence presents for evaluation of multiple complaints.  Last seen 2 years ago.   1) persistent pain involving the joints of hands feet and legs.    2) Chronic fatigue   3) numbness in the hands and arms occurring in the middle of the night . 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  Was evaluated by Charlotte Sanes , [place] for sciatica of right leg.  DJD changes seen . 6 day prednisone taper temporarily resolved  sciatica but her low back pain has become  more prominent, since then.   Currently her pain radiates down both legs but not severe. However she is reporting episodes of incontinence of bowel and bladder, and vaginal numbness.  5) History of IBS improved with daily use of a probiotic . Also having some vaginal numbness   6) History of endometrial biopsy for heavy bleeding  Tried OCP's without significant improvement.   Finally had an ablation in [place] with  OPCs x 3 months.  Has had some light spotting and cramping around the time her period should be happening.   7) Had mammogram done in [place] in November left breast calcifications requiring stereotactic biopsy , which was normal/negative.    Outpatient Prescriptions Prior to Visit  Medication Sig Dispense Refill  . Ascorbic Acid (VITAMIN C) 100 MG tablet Take 100 mg by mouth daily.    Marland Kitchen CRANBERRY EXTRACT PO Take by mouth.    Marland Kitchen FLUoxetine (PROZAC) 10 MG capsule TAKE 1 CAPSULE BY MOUTH EVERY MORNING 90  capsule 1  . Prenatal Vit-Fe Fumarate-FA (PRENATAL COMPLETE PO) Take by mouth.    . traMADol (ULTRAM) 50 MG tablet Take 1 tablet (50 mg total) by mouth every 8 (eight) hours as needed for pain. 90 tablet 3  . omeprazole (PRILOSEC) 40 MG capsule Take 1 capsule (40 mg total) by mouth daily. 30 capsule 3   No facility-administered medications prior to visit.    Review of Systems;  Patient denies headache, fevers, malaise, unintentional weight loss, skin rash, eye pain, sinus congestion and sinus pain, sore throat, dysphagia,  hemoptysis , cough, dyspnea, wheezing, chest pain, palpitations, orthopnea, edema, abdominal pain, nausea, melena, diarrhea, constipation, flank pain, dysuria, hematuria, urinary  Frequency, nocturia,seizures,  Focal weakness, Loss of consciousness,  Tremor, insomnia, depression, anxiety, and suicidal ideation.      Objective:  BP 108/70 mmHg  Pulse 75  Temp(Src) 98.8 F (37.1 C) (Oral)  Resp 14  Ht '5\' 6"'  (1.676 m)  Wt 145 lb 8 oz (65.998 kg)  BMI 23.50 kg/m2  SpO2 99%  BP Readings from Last 3 Encounters:  06/29/14 108/70  10/15/12 100/64  09/02/12 102/62    Wt Readings from Last 3 Encounters:  06/29/14 145 lb 8 oz (65.998 kg)  10/15/12 142 lb 4 oz (64.524 kg)  09/02/12 141 lb (63.957 kg)    General appearance: alert, cooperative and appears stated age Ears: normal TM's and external ear canals both ears Throat: lips, mucosa, and tongue normal; teeth and gums  normal Neck: no adenopathy, no carotid bruit, supple, symmetrical, trachea midline and thyroid not enlarged, symmetric, no tenderness/mass/nodules Back: symmetric, no curvature. ROM normal. No CVA tenderness. Lungs: clear to auscultation bilaterally Heart: regular rate and rhythm, S1, S2 normal, no murmur, click, rub or gallop Abdomen: soft, non-tender; bowel sounds normal; no masses,  no organomegaly Pulses: 2+ and symmetric Skin: Skin color, texture, turgor normal. No rashes or lesions Lymph  nodes: Cervical, supraclavicular, and axillary nodes normal.  Lab Results  Component Value Date   HGBA1C 5.2 06/29/2014    Lab Results  Component Value Date   CREATININE 0.68 06/29/2014   CREATININE 0.7 02/08/2013   CREATININE 0.61 06/05/2011    Lab Results  Component Value Date   WBC 6.4 06/29/2014   HGB 13.4 06/29/2014   HCT 39.7 06/29/2014   PLT 223.0 06/29/2014   GLUCOSE 80 06/29/2014   CHOL 138 02/08/2013   TRIG 144.0 02/08/2013   HDL 46.60 02/08/2013   LDLCALC 63 02/08/2013   ALT 15 06/29/2014   AST 21 06/29/2014   NA 136 06/29/2014   K 4.1 06/29/2014   CL 103 06/29/2014   CREATININE 0.68 06/29/2014   BUN 16 06/29/2014   CO2 27 06/29/2014   TSH 3.24 06/29/2014   HGBA1C 5.2 06/29/2014    No results found.  Assessment & Plan:   Problem List Items Addressed This Visit    Degenerative cervical spinal stenosis    With parasthesias occurring at night.  MSK and neuro exam normal.. Recommend avoiding surgery given continued occupational stresses on cervical spine and mild symptoms with no deficits on exam.       IBS (irritable bowel syndrome)    Trial of dicyclomine      Relevant Medications   lactobacillus acidophilus (BACID) TABS tablet   dicyclomine (BENTYL) 10 MG capsule   Calcification of left breast    Benign stereotactic breast biopsy Oct 2015      Sciatica    With current symptoms concerning for spinal stenosis.  Prior tintolerance to gabapentin.  MRI lumbar spie for further evaluation,  Trial of tramadol, motrin and   lyrica       Relevant Medications   pregabalin (LYRICA) 75 MG capsule   Neuropathy    Ruled out reversible causes  Lab Results  Component Value Date   TSH 3.24 06/29/2014   Lab Results  Component Value Date   VITAMINB12 355 06/29/2014   No results found for: RPR       Relevant Orders   Vitamin B12 (Completed)   Hemoglobin A1c (Completed)   RPR (Completed)   TSH (Completed)   Joint pain    screening for  inflammatory disease and referral to Dr Jefm Bryant.   Lab Results  Component Value Date   ESRSEDRATE 18 06/29/2014   Lab Results  Component Value Date   WBC 6.4 06/29/2014   HGB 13.4 06/29/2014   HCT 39.7 06/29/2014   MCV 92.5 06/29/2014   PLT 223.0 06/29/2014         Relevant Orders   Sedimentation rate (Completed)   Fatigue   Relevant Orders   Comp Met (CMET) (Completed)    Other Visit Diagnoses    Sciatica associated with disorder of lumbar spine, unspecified laterality    -  Primary    Relevant Medications    pregabalin (LYRICA) 75 MG capsule    Other Relevant Orders    MR Lumbar Spine Wo Contrast    CBC with Differential/Platelet (Completed)  I have discontinued Ms. Riedlinger's omeprazole. I am also having her start on pregabalin, dicyclomine, and traMADol. Additionally, I am having her maintain her vitamin C, CRANBERRY EXTRACT PO, Prenatal Vit-Fe Fumarate-FA (PRENATAL COMPLETE PO), traMADol, FLUoxetine, and lactobacillus acidophilus.  Meds ordered this encounter  Medications  . lactobacillus acidophilus (BACID) TABS tablet    Sig: Take 2 tablets by mouth 2 (two) times daily.  . pregabalin (LYRICA) 75 MG capsule    Sig: Take 1 capsule (75 mg total) by mouth 2 (two) times daily.    Dispense:  90 capsule    Refill:  3  . dicyclomine (BENTYL) 10 MG capsule    Sig: Take 1 capsule (10 mg total) by mouth 4 (four) times daily -  before meals and at bedtime.    Dispense:  120 capsule    Refill:  3  . traMADol (ULTRAM) 50 MG tablet    Sig: Take 1 tablet (50 mg total) by mouth every 6 (six) hours as needed.    Dispense:  90 tablet    Refill:  5    Medications Discontinued During This Encounter  Medication Reason  . omeprazole (PRILOSEC) 40 MG capsule Completed Course    Follow-up: No Follow-up on file.   Crecencio Mc, MD

## 2014-06-30 LAB — RPR

## 2014-07-01 ENCOUNTER — Encounter: Payer: Self-pay | Admitting: Internal Medicine

## 2014-07-01 DIAGNOSIS — G629 Polyneuropathy, unspecified: Secondary | ICD-10-CM | POA: Insufficient documentation

## 2014-07-01 DIAGNOSIS — M2550 Pain in unspecified joint: Secondary | ICD-10-CM | POA: Insufficient documentation

## 2014-07-01 NOTE — Assessment & Plan Note (Signed)
With parasthesias occurring at night.  MSK and neuro exam normal.. Recommend avoiding surgery given continued occupational stresses on cervical spine and mild symptoms with no deficits on exam.

## 2014-07-01 NOTE — Assessment & Plan Note (Addendum)
screening for inflammatory disease and referral to Dr Jefm Bryant.   Lab Results  Component Value Date   ESRSEDRATE 18 06/29/2014   Lab Results  Component Value Date   WBC 6.4 06/29/2014   HGB 13.4 06/29/2014   HCT 39.7 06/29/2014   MCV 92.5 06/29/2014   PLT 223.0 06/29/2014

## 2014-07-01 NOTE — Assessment & Plan Note (Signed)
Benign stereotactic breast biopsy Oct 2015

## 2014-07-01 NOTE — Assessment & Plan Note (Signed)
Ruled out reversible causes  Lab Results  Component Value Date   TSH 3.24 06/29/2014   Lab Results  Component Value Date   [id number] 414 06/29/2014   No results found for: RPR

## 2014-07-01 NOTE — Assessment & Plan Note (Signed)
Trial of dicyclomine

## 2014-07-01 NOTE — Assessment & Plan Note (Signed)
With current symptoms concerning for spinal stenosis.  Prior tintolerance to gabapentin.  MRI lumbar spie for further evaluation,  Trial of tramadol, motrin and   lyrica

## 2014-07-02 ENCOUNTER — Encounter: Payer: Self-pay | Admitting: Internal Medicine

## 2014-07-07 ENCOUNTER — Telehealth: Payer: Self-pay | Admitting: Internal Medicine

## 2014-07-07 MED ORDER — GABAPENTIN 100 MG PO CAPS: 100.0000 mg | ORAL_CAPSULE | Freq: Three times a day (TID) | ORAL | Status: DC

## 2014-07-07 NOTE — Telephone Encounter (Signed)
Ok,  Gabapentin sent,  Start with 100 mg at bedtime, can take it three times daily if needed,  Or just at night and increase the night dose gradually to 300 mg

## 2014-07-07 NOTE — Telephone Encounter (Signed)
Left message for patient to return call to office. 

## 2014-07-07 NOTE — Telephone Encounter (Signed)
Received PA request from Pharmacy for Lyrica  Pa request denied through CoverMyMeds unless patient has tried Gabapentin first no record in chart.

## 2014-07-07 NOTE — Telephone Encounter (Signed)
Patient left message tried to return call left message.

## 2014-07-08 ENCOUNTER — Other Ambulatory Visit: Payer: Self-pay | Admitting: Internal Medicine

## 2014-07-08 NOTE — Telephone Encounter (Signed)
Patient notified and voiced understanding.

## 2014-07-12 ENCOUNTER — Encounter: Payer: Self-pay | Admitting: Internal Medicine

## 2014-07-12 DIAGNOSIS — M2550 Pain in unspecified joint: Secondary | ICD-10-CM

## 2014-07-12 NOTE — Telephone Encounter (Signed)
Pt is requesting status of referral to Dr Cristi Loron in Rheumatology.  Please advise

## 2014-07-14 NOTE — Addendum Note (Signed)
Addended by: Crecencio Mc on: 07/14/2014 09:41 PM   Modules accepted: Orders

## 2014-07-22 ENCOUNTER — Telehealth: Payer: Self-pay | Admitting: Internal Medicine

## 2014-07-22 NOTE — Telephone Encounter (Signed)
Patient called checking on referral status to Dr. Jefm Bryant please advise patient.

## 2014-09-16 ENCOUNTER — Other Ambulatory Visit: Payer: Self-pay | Admitting: Internal Medicine

## 2014-09-16 DIAGNOSIS — F32A Depression, unspecified: Secondary | ICD-10-CM

## 2014-09-16 DIAGNOSIS — F329 Major depressive disorder, single episode, unspecified: Secondary | ICD-10-CM

## 2014-09-16 NOTE — Telephone Encounter (Signed)
Ok to refill Prozac last OV 6/16

## 2014-09-17 MED ORDER — FLUOXETINE HCL 10 MG PO CAPS: ORAL_CAPSULE | ORAL | Status: DC

## 2014-09-17 NOTE — Telephone Encounter (Signed)
90 day supply authorized and sent   

## 2014-10-07 ENCOUNTER — Other Ambulatory Visit: Payer: Self-pay | Admitting: Orthopedic Surgery

## 2014-10-07 DIAGNOSIS — M5431 Sciatica, right side: Secondary | ICD-10-CM

## 2014-10-19 ENCOUNTER — Ambulatory Visit: Admission: RE | Admit: 2014-10-19 | Source: Ambulatory Visit

## 2014-10-26 ENCOUNTER — Other Ambulatory Visit

## 2014-10-26 ENCOUNTER — Ambulatory Visit

## 2014-10-31 ENCOUNTER — Ambulatory Visit
Admission: RE | Admit: 2014-10-31 | Discharge: 2014-10-31 | Disposition: A | Discharge status: Home / Self Care | Source: Ambulatory Visit | Attending: Orthopedic Surgery | Admitting: Orthopedic Surgery

## 2014-10-31 DIAGNOSIS — M4806 Spinal stenosis, lumbar region: Secondary | ICD-10-CM | POA: Diagnosis not present

## 2014-10-31 DIAGNOSIS — M5431 Sciatica, right side: Secondary | ICD-10-CM

## 2014-10-31 DIAGNOSIS — M5126 Other intervertebral disc displacement, lumbar region: Secondary | ICD-10-CM | POA: Insufficient documentation

## 2014-10-31 DIAGNOSIS — M5430 Sciatica, unspecified side: Secondary | ICD-10-CM | POA: Diagnosis present

## 2014-12-11 ENCOUNTER — Encounter: Payer: Self-pay | Admitting: Internal Medicine

## 2014-12-12 ENCOUNTER — Other Ambulatory Visit: Payer: Self-pay

## 2014-12-12 DIAGNOSIS — F329 Major depressive disorder, single episode, unspecified: Secondary | ICD-10-CM

## 2014-12-12 DIAGNOSIS — F32A Depression, unspecified: Secondary | ICD-10-CM

## 2014-12-12 NOTE — Telephone Encounter (Signed)
Please advise refills 

## 2014-12-13 MED ORDER — FLUOXETINE HCL 10 MG PO CAPS: ORAL_CAPSULE | ORAL | Status: DC

## 2014-12-13 MED ORDER — TRAMADOL HCL 50 MG PO TABS: 50.0000 mg | ORAL_TABLET | Freq: Three times a day (TID) | ORAL | Status: DC | PRN

## 2014-12-13 MED ORDER — CRANBERRY EXTRACT 250 MG PO TABS: 1.0000 | ORAL_TABLET | Freq: Two times a day (BID) | ORAL | Status: AC

## 2014-12-13 NOTE — Telephone Encounter (Signed)
Refill for 30 days only ON THE TRAMADOL ; other ones sent .  Will need six month follow up prior to any more refills

## 2014-12-14 NOTE — Telephone Encounter (Signed)
Left message for patient to return call to office, patient may want script at local because only a 30 day supply approved.

## 2015-06-14 ENCOUNTER — Other Ambulatory Visit: Payer: Self-pay | Admitting: Internal Medicine

## 2015-06-14 NOTE — Telephone Encounter (Signed)
Please advise to refill last OV 6/16.

## 2015-06-15 NOTE — Telephone Encounter (Signed)
Refill for 30 days only.  OFFICE VISIT NEEDED prior to any more refills 

## 2015-06-23 ENCOUNTER — Telehealth: Payer: Self-pay | Admitting: *Deleted

## 2015-06-23 DIAGNOSIS — F32A Depression, unspecified: Secondary | ICD-10-CM

## 2015-06-23 DIAGNOSIS — F329 Major depressive disorder, single episode, unspecified: Secondary | ICD-10-CM

## 2015-06-23 MED ORDER — FLUOXETINE HCL 10 MG PO CAPS: ORAL_CAPSULE | ORAL | Status: DC

## 2015-06-23 NOTE — Telephone Encounter (Signed)
Patient has requested to have a medication refill for fluoxetine Pharmacy :she would like to use [facility] in [place] . This will be her pharmacy for now

## 2015-06-23 NOTE — Telephone Encounter (Signed)
Refill done. [facility] picked [facility] at [facility] so sent to [facility] in [facility]

## 2015-06-28 ENCOUNTER — Encounter: Payer: Self-pay | Admitting: Internal Medicine

## 2015-08-16 ENCOUNTER — Encounter: Payer: Self-pay | Admitting: Obstetrics and Gynecology

## 2015-09-20 ENCOUNTER — Other Ambulatory Visit: Payer: Self-pay | Admitting: Internal Medicine

## 2015-09-20 DIAGNOSIS — F32A Depression, unspecified: Secondary | ICD-10-CM

## 2015-09-20 DIAGNOSIS — F329 Major depressive disorder, single episode, unspecified: Secondary | ICD-10-CM

## 2015-09-20 NOTE — Telephone Encounter (Signed)
Last refill 06/23/15; 30 day supply. Pt last seen 06/29/2014. No office visit on file. Pt was advised at latest refill to schedule OV.

## 2015-09-26 ENCOUNTER — Telehealth: Payer: Self-pay | Admitting: *Deleted

## 2015-09-26 ENCOUNTER — Other Ambulatory Visit: Payer: Self-pay | Admitting: Internal Medicine

## 2015-09-26 DIAGNOSIS — F329 Major depressive disorder, single episode, unspecified: Secondary | ICD-10-CM

## 2015-09-26 DIAGNOSIS — F32A Depression, unspecified: Secondary | ICD-10-CM

## 2015-09-26 MED ORDER — FLUOXETINE HCL 10 MG PO CAPS: ORAL_CAPSULE | ORAL | 0 refills | Status: DC

## 2015-09-26 NOTE — Telephone Encounter (Signed)
Can I refill Prozac 10 mg for 30 days patient has an appointment on 10/29/15.

## 2015-09-26 NOTE — Telephone Encounter (Signed)
Patient requested a medication refill for fluoxetine. She has an appt scheduled for 10/06 Pt [phone]

## 2015-09-26 NOTE — Telephone Encounter (Signed)
Refilled for one month till appt, only, thanks

## 2015-09-27 MED ORDER — FLUOXETINE HCL 10 MG PO CAPS: ORAL_CAPSULE | ORAL | 0 refills | Status: DC

## 2015-09-28 ENCOUNTER — Encounter: Payer: Self-pay | Admitting: Obstetrics and Gynecology

## 2015-10-03 ENCOUNTER — Ambulatory Visit: Admitting: Internal Medicine

## 2015-10-27 ENCOUNTER — Ambulatory Visit: Admitting: Internal Medicine

## 2015-11-09 ENCOUNTER — Other Ambulatory Visit: Payer: Self-pay | Admitting: Obstetrics and Gynecology

## 2015-11-09 DIAGNOSIS — Z1231 Encounter for screening mammogram for malignant neoplasm of breast: Secondary | ICD-10-CM

## 2015-11-29 ENCOUNTER — Encounter: Payer: Self-pay | Admitting: Obstetrics and Gynecology

## 2015-11-29 ENCOUNTER — Other Ambulatory Visit: Payer: Self-pay | Admitting: Obstetrics and Gynecology

## 2015-11-29 ENCOUNTER — Ambulatory Visit (INDEPENDENT_AMBULATORY_CARE_PROVIDER_SITE_OTHER): Admitting: Obstetrics and Gynecology

## 2015-11-29 VITALS — BP 105/65 | HR 83 | Ht 67.0 in | Wt 142.6 lb

## 2015-11-29 DIAGNOSIS — Z23 Encounter for immunization: Secondary | ICD-10-CM

## 2015-11-29 DIAGNOSIS — F3289 Other specified depressive episodes: Secondary | ICD-10-CM

## 2015-11-29 DIAGNOSIS — Z01419 Encounter for gynecological examination (general) (routine) without abnormal findings: Secondary | ICD-10-CM

## 2015-11-29 MED ORDER — FLUOXETINE HCL 10 MG PO CAPS: ORAL_CAPSULE | ORAL | 0 refills | Status: DC

## 2015-11-29 MED ORDER — TETANUS-DIPHTH-ACELL PERTUSSIS 5-2.5-18.5 LF-MCG/0.5 IM SUSP
0.5000 mL | Freq: Once | INTRAMUSCULAR | Status: AC
  Administered 2015-11-29: 0.5 mL via INTRAMUSCULAR

## 2015-11-29 NOTE — Patient Instructions (Signed)
Preventive Care for Adults, Female A healthy lifestyle and preventive care can promote health and wellness. Preventive health guidelines for women include the following key practices.  A routine yearly physical is a good way to check with your health care provider about your health and preventive screening. It is a chance to share any concerns and updates on your health and to receive a thorough exam.  Visit your dentist for a routine exam and preventive care every 6 months. Brush your teeth twice a day and floss once a day. Good oral hygiene prevents tooth decay and gum disease.  The frequency of eye exams is based on your age, health, family medical history, use of contact lenses, and other factors. Follow your health care provider's recommendations for frequency of eye exams.  Eat a healthy diet. Foods like vegetables, fruits, whole grains, low-fat dairy products, and lean protein foods contain the nutrients you need without too many calories. Decrease your intake of foods high in solid fats, added sugars, and salt. Eat the right amount of calories for you.Get information about a proper diet from your health care provider, if necessary.  Regular physical exercise is one of the most important things you can do for your health. Most adults should get at least 150 minutes of moderate-intensity exercise (any activity that increases your heart rate and causes you to sweat) each week. In addition, most adults need muscle-strengthening exercises on 2 or more days a week.  Maintain a healthy weight. The body mass index (BMI) is a screening tool to identify possible weight problems. It provides an estimate of body fat based on height and weight. Your health care provider can find your BMI and can help you achieve or maintain a healthy weight.For adults 20 years and older:  A BMI below 18.5 is considered underweight.  A BMI of 18.5 to 24.9 is normal.  A BMI of 25 to 29.9 is considered  overweight.  A BMI of 30 and above is considered obese.  Maintain normal blood lipids and cholesterol levels by exercising and minimizing your intake of saturated fat. Eat a balanced diet with plenty of fruit and vegetables. Blood tests for lipids and cholesterol should begin at age 64 and be repeated every 5 years. If your lipid or cholesterol levels are high, you are over 50, or you are at high risk for heart disease, you may need your cholesterol levels checked more frequently.Ongoing high lipid and cholesterol levels should be treated with medicines if diet and exercise are not working.  If you smoke, find out from your health care provider how to quit. If you do not use tobacco, do not start.  Lung cancer screening is recommended for adults aged 52-80 years who are at high risk for developing lung cancer because of a history of smoking. A yearly low-dose CT scan of the lungs is recommended for people who have at least a 30-pack-year history of smoking and are a current smoker or have quit within the past 15 years. A pack year of smoking is smoking an average of 1 pack of cigarettes a day for 1 year (for example: 1 pack a day for 30 years or 2 packs a day for 15 years). Yearly screening should continue until the smoker has stopped smoking for at least 15 years. Yearly screening should be stopped for people who develop a health problem that would prevent them from having lung cancer treatment.  If you are pregnant, do not drink alcohol. If you are  breastfeeding, be very cautious about drinking alcohol. If you are not pregnant and choose to drink alcohol, do not have more than 1 drink per day. One drink is considered to be 12 ounces (355 mL) of beer, 5 ounces (148 mL) of wine, or 1.5 ounces (44 mL) of liquor.  Avoid use of street drugs. Do not share needles with anyone. Ask for help if you need support or instructions about stopping the use of drugs.  High blood pressure causes heart disease and  increases the risk of stroke. Your blood pressure should be checked at least every 1 to 2 years. Ongoing high blood pressure should be treated with medicines if weight loss and exercise do not work.  If you are 25-78 years old, ask your health care provider if you should take aspirin to prevent strokes.  Diabetes screening is done by taking a blood sample to check your blood glucose level after you have not eaten for a certain period of time (fasting). If you are not overweight and you do not have risk factors for diabetes, you should be screened once every 3 years starting at age 86. If you are overweight or obese and you are 3-87 years of age, you should be screened for diabetes every year as part of your cardiovascular risk assessment.  Breast cancer screening is essential preventive care for women. You should practice "breast self-awareness." This means understanding the normal appearance and feel of your breasts and may include breast self-examination. Any changes detected, no matter how small, should be reported to a health care provider. Women in their 66s and 30s should have a clinical breast exam (CBE) by a health care provider as part of a regular health exam every 1 to 3 years. After age 43, women should have a CBE every year. Starting at age 37, women should consider having a mammogram (breast X-ray test) every year. Women who have a family history of breast cancer should talk to their health care provider about genetic screening. Women at a high risk of breast cancer should talk to their health care providers about having an MRI and a mammogram every year.  Breast cancer gene (BRCA)-related cancer risk assessment is recommended for women who have family members with BRCA-related cancers. BRCA-related cancers include breast, ovarian, tubal, and peritoneal cancers. Having family members with these cancers may be associated with an increased risk for harmful changes (mutations) in the breast  cancer genes BRCA1 and BRCA2. Results of the assessment will determine the need for genetic counseling and BRCA1 and BRCA2 testing.  Your health care provider may recommend that you be screened regularly for cancer of the pelvic organs (ovaries, uterus, and vagina). This screening involves a pelvic examination, including checking for microscopic changes to the surface of your cervix (Pap test). You may be encouraged to have this screening done every 3 years, beginning at age 78.  For women ages 79-65, health care providers may recommend pelvic exams and Pap testing every 3 years, or they may recommend the Pap and pelvic exam, combined with testing for human papilloma virus (HPV), every 5 years. Some types of HPV increase your risk of cervical cancer. Testing for HPV may also be done on women of any age with unclear Pap test results.  Other health care providers may not recommend any screening for nonpregnant women who are considered low risk for pelvic cancer and who do not have symptoms. Ask your health care provider if a screening pelvic exam is right for  you.  If you have had past treatment for cervical cancer or a condition that could lead to cancer, you need Pap tests and screening for cancer for at least 20 years after your treatment. If Pap tests have been discontinued, your risk factors (such as having a new sexual partner) need to be reassessed to determine if screening should resume. Some women have medical problems that increase the chance of getting cervical cancer. In these cases, your health care provider may recommend more frequent screening and Pap tests.  Colorectal cancer can be detected and often prevented. Most routine colorectal cancer screening begins at the age of 50 years and continues through age 75 years. However, your health care provider may recommend screening at an earlier age if you have risk factors for colon cancer. On a yearly basis, your health care provider may provide  home test kits to check for hidden blood in the stool. Use of a small camera at the end of a tube, to directly examine the colon (sigmoidoscopy or colonoscopy), can detect the earliest forms of colorectal cancer. Talk to your health care provider about this at age 50, when routine screening begins. Direct exam of the colon should be repeated every 5-10 years through age 75 years, unless early forms of precancerous polyps or small growths are found.  People who are at an increased risk for hepatitis B should be screened for this virus. You are considered at high risk for hepatitis B if:  You were born in a country where hepatitis B occurs often. Talk with your health care provider about which countries are considered high risk.  Your parents were born in a high-risk country and you have not received a shot to protect against hepatitis B (hepatitis B vaccine).  You have HIV or AIDS.  You use needles to inject street drugs.  You live with, or have sex with, someone who has hepatitis B.  You get hemodialysis treatment.  You take certain medicines for conditions like cancer, organ transplantation, and autoimmune conditions.  Hepatitis C blood testing is recommended for all people born from 1945 through 1965 and any individual with known risks for hepatitis C.  Practice safe sex. Use condoms and avoid high-risk sexual practices to reduce the spread of sexually transmitted infections (STIs). STIs include gonorrhea, chlamydia, syphilis, trichomonas, herpes, HPV, and human immunodeficiency virus (HIV). Herpes, HIV, and HPV are viral illnesses that have no cure. They can result in disability, cancer, and death.  You should be screened for sexually transmitted illnesses (STIs) including gonorrhea and chlamydia if:  You are sexually active and are younger than 24 years.  You are older than 24 years and your health care provider tells you that you are at risk for this type of infection.  Your sexual  activity has changed since you were last screened and you are at an increased risk for chlamydia or gonorrhea. Ask your health care provider if you are at risk.  If you are at risk of being infected with HIV, it is recommended that you take a prescription medicine daily to prevent HIV infection. This is called preexposure prophylaxis (PrEP). You are considered at risk if:  You are sexually active and do not regularly use condoms or know the HIV status of your partner(s).  You take drugs by injection.  You are sexually active with a partner who has HIV.  Talk with your health care provider about whether you are at high risk of being infected with HIV. If   you choose to begin PrEP, you should first be tested for HIV. You should then be tested every 3 months for as long as you are taking PrEP.  Osteoporosis is a disease in which the bones lose minerals and strength with aging. This can result in serious bone fractures or breaks. The risk of osteoporosis can be identified using a bone density scan. Women ages 1 years and over and women at risk for fractures or osteoporosis should discuss screening with their health care providers. Ask your health care provider whether you should take a calcium supplement or vitamin D to reduce the rate of osteoporosis.  Menopause can be associated with physical symptoms and risks. Hormone replacement therapy is available to decrease symptoms and risks. You should talk to your health care provider about whether hormone replacement therapy is right for you.  Use sunscreen. Apply sunscreen liberally and repeatedly throughout the day. You should seek shade when your shadow is shorter than you. Protect yourself by wearing long sleeves, pants, a wide-brimmed hat, and sunglasses year round, whenever you are outdoors.  Once a month, do a whole body skin exam, using a mirror to look at the skin on your back. Tell your health care provider of new moles, moles that have irregular  borders, moles that are larger than a pencil eraser, or moles that have changed in shape or color.  Stay current with required vaccines (immunizations).  Influenza vaccine. All adults should be immunized every year.  Tetanus, diphtheria, and acellular pertussis (Td, Tdap) vaccine. Pregnant women should receive 1 dose of Tdap vaccine during each pregnancy. The dose should be obtained regardless of the length of time since the last dose. Immunization is preferred during the 27th-36th week of gestation. An adult who has not previously received Tdap or who does not know her vaccine status should receive 1 dose of Tdap. This initial dose should be followed by tetanus and diphtheria toxoids (Td) booster doses every 10 years. Adults with an unknown or incomplete history of completing a 3-dose immunization series with Td-containing vaccines should begin or complete a primary immunization series including a Tdap dose. Adults should receive a Td booster every 10 years.  Varicella vaccine. An adult without evidence of immunity to varicella should receive 2 doses or a second dose if she has previously received 1 dose. Pregnant females who do not have evidence of immunity should receive the first dose after pregnancy. This first dose should be obtained before leaving the health care facility. The second dose should be obtained 4-8 weeks after the first dose.  Human papillomavirus (HPV) vaccine. Females aged 13-26 years who have not received the vaccine previously should obtain the 3-dose series. The vaccine is not recommended for use in pregnant females. However, pregnancy testing is not needed before receiving a dose. If a female is found to be pregnant after receiving a dose, no treatment is needed. In that case, the remaining doses should be delayed until after the pregnancy. Immunization is recommended for any person with an immunocompromised condition through the age of 24 years if she did not get any or all doses  earlier. During the 3-dose series, the second dose should be obtained 4-8 weeks after the first dose. The third dose should be obtained 24 weeks after the first dose and 16 weeks after the second dose.  Zoster vaccine. One dose is recommended for adults aged 97 years or older unless certain conditions are present.  Measles, mumps, and rubella (MMR) vaccine. Adults born  before 1957 generally are considered immune to measles and mumps. Adults born in [birth year] or later should have 1 or more doses of MMR vaccine unless there is a contraindication to the vaccine or there is laboratory evidence of immunity to each of the three diseases. A routine second dose of MMR vaccine should be obtained at least 28 days after the first dose for students attending postsecondary schools, health care workers, or international travelers. People who received inactivated measles vaccine or an unknown type of measles vaccine during 1963-1967 should receive 2 doses of MMR vaccine. People who received inactivated mumps vaccine or an unknown type of mumps vaccine before 1979 and are at high risk for mumps infection should consider immunization with 2 doses of MMR vaccine. For females of childbearing age, rubella immunity should be determined. If there is no evidence of immunity, females who are not pregnant should be vaccinated. If there is no evidence of immunity, females who are pregnant should delay immunization until after pregnancy. Unvaccinated health care workers born before 60 who lack laboratory evidence of measles, mumps, or rubella immunity or laboratory confirmation of disease should consider measles and mumps immunization with 2 doses of MMR vaccine or rubella immunization with 1 dose of MMR vaccine.  Pneumococcal 13-valent conjugate (PCV13) vaccine. When indicated, a person who is uncertain of his immunization history and has no record of immunization should receive the PCV13 vaccine. All adults 61 years of age and older  should receive this vaccine. An adult aged 92 years or older who has certain medical conditions and has not been previously immunized should receive 1 dose of PCV13 vaccine. This PCV13 should be followed with a dose of pneumococcal polysaccharide (PPSV23) vaccine. Adults who are at high risk for pneumococcal disease should obtain the PPSV23 vaccine at least 8 weeks after the dose of PCV13 vaccine. Adults older than 49 years of age who have normal immune system function should obtain the PPSV23 vaccine dose at least 1 year after the dose of PCV13 vaccine.  Pneumococcal polysaccharide (PPSV23) vaccine. When PCV13 is also indicated, PCV13 should be obtained first. All adults aged 2 years and older should be immunized. An adult younger than age 30 years who has certain medical conditions should be immunized. Any person who resides in a nursing home or long-term care facility should be immunized. An adult smoker should be immunized. People with an immunocompromised condition and certain other conditions should receive both PCV13 and PPSV23 vaccines. People with human immunodeficiency virus (HIV) infection should be immunized as soon as possible after diagnosis. Immunization during chemotherapy or radiation therapy should be avoided. Routine use of PPSV23 vaccine is not recommended for American Indians, [place] Natives, or people younger than 65 years unless there are medical conditions that require PPSV23 vaccine. When indicated, people who have unknown immunization and have no record of immunization should receive PPSV23 vaccine. One-time revaccination 5 years after the first dose of PPSV23 is recommended for people aged 19-64 years who have chronic kidney failure, nephrotic syndrome, asplenia, or immunocompromised conditions. People who received 1-2 doses of PPSV23 before age 44 years should receive another dose of PPSV23 vaccine at age 83 years or later if at least 5 years have passed since the previous dose. Doses  of PPSV23 are not needed for people immunized with PPSV23 at or after age 20 years.  Meningococcal vaccine. Adults with asplenia or persistent complement component deficiencies should receive 2 doses of quadrivalent meningococcal conjugate (MenACWY-D) vaccine. The doses should be obtained  at least 2 months apart. Microbiologists working with certain meningococcal bacteria, [place] recruits, people at risk during an outbreak, and people who travel to or live in countries with a high rate of meningitis should be immunized. A first-year college student up through age 28 years who is living in a residence hall should receive a dose if she did not receive a dose on or after her 16th birthday. Adults who have certain high-risk conditions should receive one or more doses of vaccine.  Hepatitis A vaccine. Adults who wish to be protected from this disease, have certain high-risk conditions, work with hepatitis A-infected animals, work in hepatitis A research labs, or travel to or work in countries with a high rate of hepatitis A should be immunized. Adults who were previously unvaccinated and who anticipate close contact with an international adoptee during the first 60 days after arrival in the [place] from a country with a high rate of hepatitis A should be immunized.  Hepatitis B vaccine. Adults who wish to be protected from this disease, have certain high-risk conditions, may be exposed to blood or other infectious body fluids, are household contacts or sex partners of hepatitis B positive people, are clients or workers in certain care facilities, or travel to or work in countries with a high rate of hepatitis B should be immunized.  Haemophilus influenzae type b (Hib) vaccine. A previously unvaccinated person with asplenia or sickle cell disease or having a scheduled splenectomy should receive 1 dose of Hib vaccine. Regardless of previous immunization, a recipient of a hematopoietic stem cell transplant  should receive a 3-dose series 6-12 months after her successful transplant. Hib vaccine is not recommended for adults with HIV infection. Preventive Services / Frequency Ages 71 to 87 years  Blood pressure check.** / Every 3-5 years.  Lipid and cholesterol check.** / Every 5 years beginning at age 1.  Clinical breast exam.** / Every 3 years for women in their 3s and 31s.  BRCA-related cancer risk assessment.** / For women who have family members with a BRCA-related cancer (breast, ovarian, tubal, or peritoneal cancers).  Pap test.** / Every 2 years from ages 50 through 86. Every 3 years starting at age 87 through age 7 or 75 with a history of 3 consecutive normal Pap tests.  HPV screening.** / Every 3 years from ages 59 through ages 35 to 6 with a history of 3 consecutive normal Pap tests.  Hepatitis C blood test.** / For any individual with known risks for hepatitis C.  Skin self-exam. / Monthly.  Influenza vaccine. / Every year.  Tetanus, diphtheria, and acellular pertussis (Tdap, Td) vaccine.** / Consult your health care provider. Pregnant women should receive 1 dose of Tdap vaccine during each pregnancy. 1 dose of Td every 10 years.  Varicella vaccine.** / Consult your health care provider. Pregnant females who do not have evidence of immunity should receive the first dose after pregnancy.  HPV vaccine. / 3 doses over 6 months, if 72 and younger. The vaccine is not recommended for use in pregnant females. However, pregnancy testing is not needed before receiving a dose.  Measles, mumps, rubella (MMR) vaccine.** / You need at least 1 dose of MMR if you were born in 1957 or later. You may also need a 2nd dose. For females of childbearing age, rubella immunity should be determined. If there is no evidence of immunity, females who are not pregnant should be vaccinated. If there is no evidence of immunity, females who are  pregnant should delay immunization until after  pregnancy.  Pneumococcal 13-valent conjugate (PCV13) vaccine.** / Consult your health care provider.  Pneumococcal polysaccharide (PPSV23) vaccine.** / 1 to 2 doses if you smoke cigarettes or if you have certain conditions.  Meningococcal vaccine.** / 1 dose if you are age 87 to 44 years and a Market researcher living in a residence hall, or have one of several medical conditions, you need to get vaccinated against meningococcal disease. You may also need additional booster doses.  Hepatitis A vaccine.** / Consult your health care provider.  Hepatitis B vaccine.** / Consult your health care provider.  Haemophilus influenzae type b (Hib) vaccine.** / Consult your health care provider. Ages 86 to 38 years  Blood pressure check.** / Every year.  Lipid and cholesterol check.** / Every 5 years beginning at age 49 years.  Lung cancer screening. / Every year if you are aged 71-80 years and have a 30-pack-year history of smoking and currently smoke or have quit within the past 15 years. Yearly screening is stopped once you have quit smoking for at least 15 years or develop a health problem that would prevent you from having lung cancer treatment.  Clinical breast exam.** / Every year after age 51 years.  BRCA-related cancer risk assessment.** / For women who have family members with a BRCA-related cancer (breast, ovarian, tubal, or peritoneal cancers).  Mammogram.** / Every year beginning at age 18 years and continuing for as long as you are in good health. Consult with your health care provider.  Pap test.** / Every 3 years starting at age 63 years through age 37 or 57 years with a history of 3 consecutive normal Pap tests.  HPV screening.** / Every 3 years from ages 41 years through ages 76 to 23 years with a history of 3 consecutive normal Pap tests.  Fecal occult blood test (FOBT) of stool. / Every year beginning at age 36 years and continuing until age 51 years. You may not need  to do this test if you get a colonoscopy every 10 years.  Flexible sigmoidoscopy or colonoscopy.** / Every 5 years for a flexible sigmoidoscopy or every 10 years for a colonoscopy beginning at age 36 years and continuing until age 35 years.  Hepatitis C blood test.** / For all people born from [birth year] through 1965 and any individual with known risks for hepatitis C.  Skin self-exam. / Monthly.  Influenza vaccine. / Every year.  Tetanus, diphtheria, and acellular pertussis (Tdap/Td) vaccine.** / Consult your health care provider. Pregnant women should receive 1 dose of Tdap vaccine during each pregnancy. 1 dose of Td every 10 years.  Varicella vaccine.** / Consult your health care provider. Pregnant females who do not have evidence of immunity should receive the first dose after pregnancy.  Zoster vaccine.** / 1 dose for adults aged 73 years or older.  Measles, mumps, rubella (MMR) vaccine.** / You need at least 1 dose of MMR if you were born in 1957 or later. You may also need a second dose. For females of childbearing age, rubella immunity should be determined. If there is no evidence of immunity, females who are not pregnant should be vaccinated. If there is no evidence of immunity, females who are pregnant should delay immunization until after pregnancy.  Pneumococcal 13-valent conjugate (PCV13) vaccine.** / Consult your health care provider.  Pneumococcal polysaccharide (PPSV23) vaccine.** / 1 to 2 doses if you smoke cigarettes or if you have certain conditions.  Meningococcal vaccine.** /  Consult your health care provider.  Hepatitis A vaccine.** / Consult your health care provider.  Hepatitis B vaccine.** / Consult your health care provider.  Haemophilus influenzae type b (Hib) vaccine.** / Consult your health care provider. Ages 80 years and over  Blood pressure check.** / Every year.  Lipid and cholesterol check.** / Every 5 years beginning at age 62 years.  Lung cancer  screening. / Every year if you are aged 32-80 years and have a 30-pack-year history of smoking and currently smoke or have quit within the past 15 years. Yearly screening is stopped once you have quit smoking for at least 15 years or develop a health problem that would prevent you from having lung cancer treatment.  Clinical breast exam.** / Every year after age 61 years.  BRCA-related cancer risk assessment.** / For women who have family members with a BRCA-related cancer (breast, ovarian, tubal, or peritoneal cancers).  Mammogram.** / Every year beginning at age 39 years and continuing for as long as you are in good health. Consult with your health care provider.  Pap test.** / Every 3 years starting at age 85 years through age 74 or 72 years with 3 consecutive normal Pap tests. Testing can be stopped between 65 and 70 years with 3 consecutive normal Pap tests and no abnormal Pap or HPV tests in the past 10 years.  HPV screening.** / Every 3 years from ages 55 years through ages 67 or 77 years with a history of 3 consecutive normal Pap tests. Testing can be stopped between 65 and 70 years with 3 consecutive normal Pap tests and no abnormal Pap or HPV tests in the past 10 years.  Fecal occult blood test (FOBT) of stool. / Every year beginning at age 81 years and continuing until age 22 years. You may not need to do this test if you get a colonoscopy every 10 years.  Flexible sigmoidoscopy or colonoscopy.** / Every 5 years for a flexible sigmoidoscopy or every 10 years for a colonoscopy beginning at age 67 years and continuing until age 22 years.  Hepatitis C blood test.** / For all people born from [birth year] through 1964 and any individual with known risks for hepatitis C.  Osteoporosis screening.** / A one-time screening for women ages 8 years and over and women at risk for fractures or osteoporosis.  Skin self-exam. / Monthly.  Influenza vaccine. / Every year.  Tetanus, diphtheria, and  acellular pertussis (Tdap/Td) vaccine.** / 1 dose of Td every 10 years.  Varicella vaccine.** / Consult your health care provider.  Zoster vaccine.** / 1 dose for adults aged 56 years or older.  Pneumococcal 13-valent conjugate (PCV13) vaccine.** / Consult your health care provider.  Pneumococcal polysaccharide (PPSV23) vaccine.** / 1 dose for all adults aged 15 years and older.  Meningococcal vaccine.** / Consult your health care provider.  Hepatitis A vaccine.** / Consult your health care provider.  Hepatitis B vaccine.** / Consult your health care provider.  Haemophilus influenzae type b (Hib) vaccine.** / Consult your health care provider. ** Family history and personal history of risk and conditions may change your health care provider's recommendations.   This information is not intended to replace advice given to you by your health care provider. Make sure you discuss any questions you have with your health care provider.   Document Released: 03/05/2001 Document Revised: 01/28/2014 Document Reviewed: 06/04/2010 Elsevier Interactive Patient Education [employer].

## 2015-11-29 NOTE — Progress Notes (Signed)
Subjective:   Debbie Lawrence is a 50 y.o. G80P0 Caucasian female here for a routine well-woman exam.  No LMP recorded. Patient has had an ablation.    Current complaints: none gynecologically PCP: Tullo       does desire labs  Social History: Sexual: heterosexual Marital Status: married Living situation: with spouse Occupation: self-employed Tobacco/alcohol: no tobacco use Illicit drugs: no history of illicit drug use  The following portions of the patient's history were reviewed and updated as appropriate: allergies, current medications, past family history, past medical history, past social history, past surgical history and problem list.  Past Medical History Past Medical History:  Diagnosis Date  . Degenerative cervical spinal stenosis    occupational, [employer]    Past Surgical History Past Surgical History:  Procedure Laterality Date  . ablation    . BREAST BIOPSY     fibronadenoma    Gynecologic History G2P0  No LMP recorded. Patient has had an ablation. Contraception: vasectomy Last Pap: 2014. Results were: normal Last mammogram: 2015. Results were: normal   Obstetric History OB History  Gravida Para Term Preterm AB Living  2            SAB TAB Ectopic Multiple Live Births               # Outcome Date GA Lbr Len/2nd Weight Sex Delivery Anes PTL Lv  2 Gravida 2005    F Vag-Spont     1 Gravida 1990    F Vag-Spont         Current Medications Current Outpatient Prescriptions on File Prior to Visit  Medication Sig Dispense Refill  . Ascorbic Acid (VITAMIN C) 100 MG tablet Take 100 mg by mouth daily.    . Cranberry Extract 250 MG TABS Take 1 tablet by mouth 2 (two) times daily at 10 AM and 5 PM. 180 each 2  . traMADol (ULTRAM) 50 MG tablet Take 1 tablet (50 mg total) by mouth every 6 (six) hours as needed. 90 tablet 5  . dicyclomine (BENTYL) 10 MG capsule Take 1 capsule (10 mg total) by mouth 4 (four) times daily -  before meals and at bedtime. 120 capsule  3  . FLUoxetine (PROZAC) 10 MG capsule TAKE 1 CAPSULE BY MOUTH EVERY MORNING. MUST KEEP APPT (Patient not taking: Reported on 11/29/2015) 30 capsule 0  . gabapentin (NEURONTIN) 100 MG capsule Take 1 capsule (100 mg total) by mouth 3 (three) times daily. (Patient not taking: Reported on 11/29/2015) 90 capsule 3  . lactobacillus acidophilus (BACID) TABS tablet Take 2 tablets by mouth 2 (two) times daily.    . pregabalin (LYRICA) 75 MG capsule Take 1 capsule (75 mg total) by mouth 2 (two) times daily. (Patient not taking: Reported on 11/29/2015) 90 capsule 3  . Prenatal Vit-Fe Fumarate-FA (PRENATAL COMPLETE PO) Take by mouth.    . traMADol (ULTRAM) 50 MG tablet Take 1 tablet (50 mg total) by mouth every 8 (eight) hours as needed. (Patient not taking: Reported on 11/29/2015) 90 tablet 0   No current facility-administered medications on file prior to visit.     Review of Systems Patient denies any headaches, blurred vision, shortness of breath, chest pain, abdominal pain, problems with bowel movements, urination, or intercourse.  Objective:  BP 105/65   Pulse 83   Ht 5\' 7"  (1.702 m)   Wt 142 lb 9.6 oz (64.7 kg)   BMI 22.33 kg/m  Physical Exam  General:  Well developed, well  nourished, no acute distress. She is alert and oriented x3. Skin:  Warm and dry Neck:  Midline trachea, no thyromegaly or nodules Cardiovascular: Regular rate and rhythm, no murmur heard Lungs:  Effort normal, all lung fields clear to auscultation bilaterally Breasts:  No dominant palpable mass, retraction, or nipple discharge Abdomen:  Soft, non tender, no hepatosplenomegaly or masses Pelvic:  External genitalia is normal in appearance.  The vagina is normal in appearance. The cervix is bulbous, no CMT.  Thin prep pap is done with HR HPV cotesting. Uterus is felt to be normal size, shape, and contour.  No adnexal masses or tenderness noted. Extremities:  No swelling or varicosities noted Psych:  She has a normal mood and  affect  Assessment:   Healthy well-woman exam Needs TDAP   Plan:  Labs and pap obtained Declined flu vaccine TDPA given F/U 1 year for AE, or sooner if needed Mammogram ordered  Allysha Tryon Rockney Ghee, CNM

## 2015-11-30 LAB — CBC
HEMATOCRIT: 38.5 % (ref 34.0–46.6)
Hemoglobin: 12.9 g/dL (ref 11.1–15.9)
MCH: 30.9 pg (ref 26.6–33.0)
MCHC: 33.5 g/dL (ref 31.5–35.7)
MCV: 92 fL (ref 79–97)
PLATELETS: 258 10*3/uL (ref 150–379)
RBC: 4.17 x10E6/uL (ref 3.77–5.28)
RDW: 13.5 % (ref 12.3–15.4)
WBC: 7 10*3/uL (ref 3.4–10.8)

## 2015-11-30 LAB — VITAMIN D 25 HYDROXY (VIT D DEFICIENCY, FRACTURES): VIT D 25 HYDROXY: 42.3 ng/mL (ref 30.0–100.0)

## 2015-11-30 LAB — COMPREHENSIVE METABOLIC PANEL
ALBUMIN: 4.6 g/dL (ref 3.5–5.5)
ALT: 11 IU/L (ref 0–32)
AST: 16 IU/L (ref 0–40)
Albumin/Globulin Ratio: 2 (ref 1.2–2.2)
Alkaline Phosphatase: 82 IU/L (ref 39–117)
BILIRUBIN TOTAL: 0.5 mg/dL (ref 0.0–1.2)
BUN / CREAT RATIO: 21 (ref 9–23)
BUN: 16 mg/dL (ref 6–24)
CHLORIDE: 101 mmol/L (ref 96–106)
CO2: 26 mmol/L (ref 18–29)
CREATININE: 0.77 mg/dL (ref 0.57–1.00)
Calcium: 9.4 mg/dL (ref 8.7–10.2)
GFR calc non Af Amer: 91 mL/min/{1.73_m2} (ref >59)
GFR, EST AFRICAN AMERICAN: 105 mL/min/{1.73_m2} (ref >59)
GLUCOSE: 79 mg/dL (ref 65–99)
Globulin, Total: 2.3 g/dL (ref 1.5–4.5)
Potassium: 4.3 mmol/L (ref 3.5–5.2)
Sodium: 140 mmol/L (ref 134–144)
TOTAL PROTEIN: 6.9 g/dL (ref 6.0–8.5)

## 2015-11-30 LAB — THYROID PANEL WITH TSH
Free Thyroxine Index: 1.6 (ref 1.2–4.9)
T3 Uptake Ratio: 26 % (ref 24–39)
T4 TOTAL: 6.2 ug/dL (ref 4.5–12.0)
TSH: 3.87 u[IU]/mL (ref 0.450–4.500)

## 2015-11-30 LAB — LIPID PANEL
CHOL/HDL RATIO: 2.7 ratio (ref 0.0–4.4)
Cholesterol, Total: 156 mg/dL (ref 100–199)
HDL: 57 mg/dL (ref >39)
LDL Calculated: 80 mg/dL (ref 0–99)
Triglycerides: 94 mg/dL (ref 0–149)
VLDL Cholesterol Cal: 19 mg/dL (ref 5–40)

## 2015-11-30 LAB — CYTOLOGY - PAP

## 2015-12-11 ENCOUNTER — Ambulatory Visit
Admission: RE | Admit: 2015-12-11 | Discharge: 2015-12-11 | Disposition: A | Discharge status: Home / Self Care | Source: Ambulatory Visit | Attending: Obstetrics and Gynecology | Admitting: Obstetrics and Gynecology

## 2015-12-11 DIAGNOSIS — Z1231 Encounter for screening mammogram for malignant neoplasm of breast: Secondary | ICD-10-CM | POA: Diagnosis not present

## 2015-12-11 DIAGNOSIS — R928 Other abnormal and inconclusive findings on diagnostic imaging of breast: Secondary | ICD-10-CM | POA: Diagnosis not present

## 2015-12-12 ENCOUNTER — Other Ambulatory Visit: Payer: Self-pay | Admitting: Obstetrics and Gynecology

## 2015-12-12 DIAGNOSIS — N6320 Unspecified lump in the left breast, unspecified quadrant: Secondary | ICD-10-CM

## 2015-12-28 ENCOUNTER — Ambulatory Visit
Admission: RE | Admit: 2015-12-28 | Discharge: 2015-12-28 | Disposition: A | Discharge status: Home / Self Care | Source: Ambulatory Visit | Attending: Obstetrics and Gynecology | Admitting: Obstetrics and Gynecology

## 2015-12-28 DIAGNOSIS — N6320 Unspecified lump in the left breast, unspecified quadrant: Secondary | ICD-10-CM

## 2015-12-28 DIAGNOSIS — N6002 Solitary cyst of left breast: Secondary | ICD-10-CM | POA: Diagnosis not present

## 2016-09-28 ENCOUNTER — Encounter: Payer: Self-pay | Admitting: Emergency Medicine

## 2016-09-28 ENCOUNTER — Emergency Department
Admission: EM | Admit: 2016-09-28 | Discharge: 2016-09-28 | Disposition: A | Discharge status: Home / Self Care | Attending: Emergency Medicine | Admitting: Emergency Medicine

## 2016-09-28 ENCOUNTER — Emergency Department

## 2016-09-28 DIAGNOSIS — M546 Pain in thoracic spine: Secondary | ICD-10-CM | POA: Diagnosis not present

## 2016-09-28 DIAGNOSIS — M5414 Radiculopathy, thoracic region: Secondary | ICD-10-CM | POA: Diagnosis not present

## 2016-09-28 DIAGNOSIS — M545 Low back pain: Secondary | ICD-10-CM | POA: Diagnosis present

## 2016-09-28 DIAGNOSIS — M4802 Spinal stenosis, cervical region: Secondary | ICD-10-CM | POA: Insufficient documentation

## 2016-09-28 DIAGNOSIS — R61 Generalized hyperhidrosis: Secondary | ICD-10-CM | POA: Insufficient documentation

## 2016-09-28 DIAGNOSIS — Z87891 Personal history of nicotine dependence: Secondary | ICD-10-CM | POA: Diagnosis not present

## 2016-09-28 DIAGNOSIS — R200 Anesthesia of skin: Secondary | ICD-10-CM | POA: Diagnosis not present

## 2016-09-28 LAB — COMPREHENSIVE METABOLIC PANEL
ALK PHOS: 80 U/L (ref 38–126)
ALT: 14 U/L (ref 14–54)
AST: 23 U/L (ref 15–41)
Albumin: 4.6 g/dL (ref 3.5–5.0)
Anion gap: 10 (ref 5–15)
BUN: 18 mg/dL (ref 6–20)
CO2: 26 mmol/L (ref 22–32)
CREATININE: 0.58 mg/dL (ref 0.44–1.00)
Calcium: 9.7 mg/dL (ref 8.9–10.3)
Chloride: 103 mmol/L (ref 101–111)
Glucose, Bld: 112 mg/dL — ABNORMAL HIGH (ref 65–99)
Potassium: 3 mmol/L — ABNORMAL LOW (ref 3.5–5.1)
Sodium: 139 mmol/L (ref 135–145)
Total Bilirubin: 0.7 mg/dL (ref 0.3–1.2)
Total Protein: 8.1 g/dL (ref 6.5–8.1)

## 2016-09-28 LAB — CBC WITH DIFFERENTIAL/PLATELET
Basophils Absolute: 0.1 10*3/uL (ref 0–0.1)
Basophils Relative: 1 %
EOS PCT: 0 %
Eosinophils Absolute: 0 10*3/uL (ref 0–0.7)
HEMATOCRIT: 39.7 % (ref 35.0–47.0)
HEMOGLOBIN: 13.5 g/dL (ref 12.0–16.0)
LYMPHS ABS: 2.6 10*3/uL (ref 1.0–3.6)
LYMPHS PCT: 16 %
MCH: 31.7 pg (ref 26.0–34.0)
MCHC: 33.9 g/dL (ref 32.0–36.0)
MCV: 93.4 fL (ref 80.0–100.0)
Monocytes Absolute: 1.1 10*3/uL — ABNORMAL HIGH (ref 0.2–0.9)
Monocytes Relative: 7 %
Neutro Abs: 12.5 10*3/uL — ABNORMAL HIGH (ref 1.4–6.5)
Neutrophils Relative %: 76 %
Platelets: 262 10*3/uL (ref 150–440)
RBC: 4.25 MIL/uL (ref 3.80–5.20)
RDW: 13.5 % (ref 11.5–14.5)
WBC: 16.3 10*3/uL — AB (ref 3.6–11.0)

## 2016-09-28 MED ORDER — ONDANSETRON HCL 4 MG/2ML IJ SOLN
4.0000 mg | INTRAMUSCULAR | Status: AC
  Administered 2016-09-28: 4 mg via INTRAVENOUS
  Filled 2016-09-28: qty 2

## 2016-09-28 MED ORDER — DEXAMETHASONE SODIUM PHOSPHATE 10 MG/ML IJ SOLN
10.0000 mg | Freq: Once | INTRAMUSCULAR | Status: AC
  Administered 2016-09-28: 10 mg via INTRAVENOUS
  Filled 2016-09-28: qty 1

## 2016-09-28 MED ORDER — MORPHINE SULFATE (PF) 4 MG/ML IV SOLN
4.0000 mg | Freq: Once | INTRAVENOUS | Status: AC
  Administered 2016-09-28: 4 mg via INTRAVENOUS
  Filled 2016-09-28: qty 1

## 2016-09-28 MED ORDER — OXYCODONE-ACETAMINOPHEN 5-325 MG PO TABS
2.0000 | ORAL_TABLET | Freq: Once | ORAL | Status: AC
  Administered 2016-09-28: 2 via ORAL
  Filled 2016-09-28: qty 2

## 2016-09-28 MED ORDER — KETOROLAC TROMETHAMINE 30 MG/ML IJ SOLN
15.0000 mg | Freq: Once | INTRAMUSCULAR | Status: AC
  Administered 2016-09-28: 15 mg via INTRAVENOUS
  Filled 2016-09-28: qty 1

## 2016-09-28 NOTE — ED Notes (Addendum)
Pt taken to MRI via stretcher by this RN.

## 2016-09-28 NOTE — ED Notes (Signed)
Patient remains in MRI 

## 2016-09-28 NOTE — Discharge Instructions (Signed)
as we discussed, we obtained an MRI of your neck and upper ( thoracic) pack, and you do have some disc abnormalities but no emergent findings requiring surgery or immediate intervention.  We encourage you to take the medications you have been prescribed by Dr. Sharlet Salina and follow-up with him in 2 days as scheduled.    Return to the emergency department if you develop new or worsening symptoms that concern you.

## 2016-09-28 NOTE — ED Triage Notes (Signed)
Pt presents to ED via [facility] with c/o L upper back pain between her shoulder blades. EMS reports pt was seen by pain management for pain, pt reports has been seen for it in over a year. EMS reports that pain has been intermittent x 1 week with sudden worsening this AM and the pain is now constant. Pt reports numbness and tingling to L arm. Per EMS BP 150/80, HR 70, O2 100% on RA.

## 2016-09-28 NOTE — ED Provider Notes (Signed)
[facility] Emergency Department Provider Note  ____________________________________________   First MD Initiated Contact with Patient 09/28/16 [phone]     (approximate)  I have reviewed the triage vital signs and the nursing notes.   HISTORY  Chief Complaint Back Pain    HPI Debbie Lawrence is a 51 y.o. female with a history of chronic back pain primarily in her lumbar spine but also in the C and T-spine as well with intermittent episodes of thoracic muscle spasms who goes to Dr. Sharlet Lawrence for chronic pain and injections.  She presents by EMS today for evaluation of acute worsening of her chronic upper back pain.  She states that it is been gradually worsening over the last week and she started on prednisone about 5 days ago prescribed by Dr. Sharlet Lawrence. she reports that she had a good day yesterday but then awoke in severe pain today.  She describes both sharp and spasming pain in the upper middle part of her back to the left of center that radiates around into her left arm with some numbness going down her left arm and some numbness in her left chest.  She denies chest pain and shortness of breath.  She reports that she has been having night sweats for "a long time" and thinks it may be due to hormones.  She denies recent fever/chills during the day, and denies any recent viral symptoms.  No focal weakness in arms nor legs, just numbness radiating from left back to left arm.  No neck pain at this time, and her low back pain with sciatica, which is usually her primary issue, is not causing her problems today.     Past Medical History:  Diagnosis Date  . Degenerative cervical spinal stenosis    occupational, Debbie Lawrence    Patient Active Problem List   Diagnosis Date Noted  . Neuropathy 07/01/2014  . Joint pain 07/01/2014  . Sciatica 06/29/2014  . Calcification of left breast 12/13/2013  . Routine general medical examination at a health care facility 10/17/2012  .  Cyst of eye 09/02/2012  . Liver mass, right lobe 07/05/2011  . IBS (irritable bowel syndrome) 06/30/2011  . Back pain, thoracic 06/26/2011  . Stress incontinence 06/26/2011  . Fatigue 06/26/2011  . Degenerative cervical spinal stenosis     Past Surgical History:  Procedure Laterality Date  . ablation    . BREAST BIOPSY Right 2010?   neg  . BREAST BIOPSY Left 2015   neg fibroadenoma    Prior to Admission medications   Medication Sig Start Date End Date Taking? Authorizing Provider  Ascorbic Acid (VITAMIN C) 100 MG tablet Take 100 mg by mouth daily.    [provider]  Cranberry Extract 250 MG TABS Take 1 tablet by mouth 2 (two) times daily at 10 AM and 5 PM. 12/13/14   Crecencio Mc, MD  cyclobenzaprine (FLEXERIL) 5 MG tablet Take 5 mg by mouth 3 (three) times daily as needed for muscle spasms.    [provider]  dicyclomine (BENTYL) 10 MG capsule Take 1 capsule (10 mg total) by mouth 4 (four) times daily -  before meals and at bedtime. 06/29/14   Crecencio Mc, MD  FLUoxetine (PROZAC) 10 MG capsule TAKE 1 CAPSULE BY MOUTH EVERY MORNING. MUST KEEP APPT Patient not taking: Reported on 11/29/2015 09/27/15   Crecencio Mc, MD  FLUoxetine (PROZAC) 10 MG capsule TAKE 1 CAPSULE BY MOUTH EVERY MORNING 11/29/15   Gayla Medicus, Melody  N, CNM  gabapentin (NEURONTIN) 100 MG capsule Take 1 capsule (100 mg total) by mouth 3 (three) times daily. Patient not taking: Reported on 11/29/2015 07/07/14   Crecencio Mc, MD  lactobacillus acidophilus (BACID) TABS tablet Take 2 tablets by mouth 2 (two) times daily.    [provider]  omega-3 acid ethyl esters (LOVAZA) 1 g capsule Take by mouth 2 (two) times daily.    [provider]  pregabalin (LYRICA) 75 MG capsule Take 1 capsule (75 mg total) by mouth 2 (two) times daily. Patient not taking: Reported on 11/29/2015 06/29/14   Crecencio Mc, MD  Prenatal Vit-Fe Fumarate-FA (PRENATAL COMPLETE PO) Take by mouth.    [provider]  traMADol (ULTRAM) 50 MG tablet Take 1 tablet (50 mg total) by mouth every 6 (six) hours as needed. 06/29/14   Crecencio Mc, MD  traMADol (ULTRAM) 50 MG tablet Take 1 tablet (50 mg total) by mouth every 8 (eight) hours as needed. Patient not taking: Reported on 11/29/2015 12/13/14   Crecencio Mc, MD    Allergies Vicodin [hydrocodone-acetaminophen] and Sulfa antibiotics  Family History  Problem Relation Age of Onset  . Hypertension Mother   . Hypertension Father   . Cancer Sister        breast  . Breast cancer Sister     Social History Social History  Substance Use Topics  . Smoking status: Former Smoker    Years: 15.00    Types: Cigarettes    Quit date: 06/12/2011  . Smokeless tobacco: Never Used  . Alcohol use No    Review of Systems Constitutional: No fever/chills Eyes: No visual changes. ENT: No sore throat. Cardiovascular: Denies chest pain.  she is having some chest "numbness" that seems to be numbness radiating from the left side of her back and down her left arm Respiratory: Denies shortness of breath. Gastrointestinal: No abdominal pain.  No nausea, no vomiting.  No diarrhea.  No constipation. Genitourinary: Negative for dysuria. Musculoskeletal: severe left sided upper middle back pain that is both sharp and throbbing and cramping and radiating down her left arm with numbness Integumentary: Negative for rash. Neurological: numbness in her back and radiating down left arm.  Otherwise negative for headaches, focal weakness or numbness.   ____________________________________________   PHYSICAL EXAM:  VITAL SIGNS: ED Triage Vitals  Enc Vitals Group     BP 09/28/16 0829 (!) 140/95     Pulse Rate 09/28/16 0829 75     Resp --      Temp 09/28/16 0829 97.8 F (36.6 C)     Temp Source 09/28/16 0829 Oral     SpO2 09/28/16 0829 100 %     Weight 09/28/16 0832 63.5 kg (140 lb)     Height 09/28/16 0832 1.676 m (5\' 6" )     Head Circumference --       Peak Flow --      Pain Score 09/28/16 0831 10     Pain Loc --      Pain Edu? --      Excl. in [place]? --     Constitutional: Alert and oriented, moaning and shaking, holding an empty emesis back to her mouth Eyes: Conjunctivae are normal. PERRL. EOMI. Head: Atraumatic. Nose: No congestion/rhinnorhea. Mouth/Throat: Mucous membranes are moist. Neck: No stridor.  No meningeal signs.  No cervical spine tenderness to palpation. Cardiovascular: Normal rate, regular rhythm. Good peripheral circulation. Grossly normal heart sounds. Respiratory: Normal respiratory effort.  No retractions. Lungs CTAB. Gastrointestinal: Soft and nontender. No distention.  Musculoskeletal: no tenderness to palpation of the cervical spine, thoracic spine, nor lumbar spine.  There are no areas of induration or fluctuance to either side of the spine and no erythema to suggest an infectious process.  She identifies the area of the greatest discomfort as similar in her left scapular region although it does not seem to be particularly tender to palpation. No lower extremity tenderness nor edema. No gross deformities of extremities. Neurologic:  Normal speech and language. No gross focal neurologic deficits are appreciated.  Skin:  Skin is warm, dry and intact. No rash noted. Psychiatric: the patient is upset and anxious and seems to be in significant amount of pain, moaning and shaking slightly that she is able to articulate her issues to me without difficulty  ____________________________________________   LABS (all labs ordered are listed, but only abnormal results are displayed)  Labs Reviewed  CBC WITH DIFFERENTIAL/PLATELET - Abnormal; Notable for the following:       Result Value   WBC 16.3 (*)    Neutro Abs 12.5 (*)    Monocytes Absolute 1.1 (*)    All other components within normal limits  COMPREHENSIVE METABOLIC PANEL - Abnormal; Notable for the following:    Potassium 3.0 (*)    Glucose, Bld 112 (*)    All  other components within normal limits   ____________________________________________  EKG  ED ECG REPORT I, Dartanian Knaggs, the attending physician, personally viewed and interpreted this ECG.  Date: 09/28/2016 EKG Time: 8:36 Rate: 67 Rhythm: borderline intraventricular conduction delay QRS Axis: normal Intervals: normal ST/T Wave abnormalities: Non-specific ST segment / T-wave changes, but no evidence of acute ischemia. Narrative Interpretation: no evidence of acute ischemia   ____________________________________________  RADIOLOGY   Mr Cervical Spine Wo Contrast  Result Date: 09/28/2016 CLINICAL DATA:  Severe spine and upper back pain that started last PM but got much worse this AM. Patient states the pain has made her arms feel weak and numb. EXAM: MRI CERVICAL AND THORACIC SPINE WITHOUT CONTRAST TECHNIQUE: Multiplanar and multiecho pulse sequences of the cervical and thoracic spine were obtained without intravenous contrast. COMPARISON:  08/15/2010 FINDINGS: MRI CERVICAL SPINE FINDINGS Alignment: Physiologic. Vertebrae: No fracture, evidence of discitis, or bone lesion. Cord: Normal signal and morphology. Posterior Fossa, vertebral arteries, paraspinal tissues: Posterior fossa demonstrates no focal abnormality. Vertebral artery flow voids are maintained. Paraspinal soft tissues are unremarkable. Disc levels: Discs: Disc spaces are maintained. C2-3: No significant disc bulge. No neural foraminal stenosis. No central canal stenosis. C3-4: Minimal broad-based disc bulge. Mild right foraminal stenosis. No central canal stenosis. C4-5: No significant disc bulge. No neural foraminal stenosis. No central canal stenosis. C5-6: Small left paracentral disc protrusion. Mild left foraminal stenosis. No central canal stenosis. C6-7: Mild broad-based disc bulge. Moderate left foraminal stenosis. No right foraminal stenosis. No neural foraminal stenosis. No central canal stenosis. C7-T1: No significant  disc bulge. No neural foraminal stenosis. No central canal stenosis. MRI THORACIC SPINE FINDINGS Segmentation:  Standard. Alignment:  Physiologic. Vertebrae:  No fracture, evidence of discitis, or bone lesion. Cord: Normal signal and morphology. Paraspinal and other soft tissues: No paraspinal abnormality. Small 10 mm T2 hyperintense right posterior hepatic mass most consistent with a small cyst. Disc levels: Disc spaces: Degenerative disc disease mild disc height loss at T4-5, T5-6 and T6-7. T1-T2: No disc protrusion.  No foraminal or central canal stenosis. T2-T3: No disc protrusion.  No foraminal or central  canal stenosis. T3-T4: No disc protrusion.  No foraminal or central canal stenosis. T4-T5: Small left paracentral disc protrusion. No foraminal or central canal stenosis. T5-T6: Mild broad-based disc bulge. No foraminal or central canal stenosis. T6-T7: Mild broad-based disc bulge. No foraminal or central canal stenosis. T7-T8: Small left paracentral disc protrusion. No foraminal or central canal stenosis. T8-T9: No disc protrusion.  No foraminal or central canal stenosis. T9-T10: No disc protrusion.  No foraminal or central canal stenosis. T10-T11: No disc protrusion. No foraminal or central canal stenosis. T11-T12: No disc protrusion. No foraminal or central canal stenosis. IMPRESSION: 1.  No acute osseous injury of the cervical spine. 2.  No acute osseous injury of the thoracic spine. 3. At C5-6 there is a small left paracentral disc protrusion. Mild left foraminal stenosis. 4. At C6-7 there is a mild broad-based disc bulge with moderate left foraminal stenosis. 5. At C3-4 there is minimal broad-based disc bulge and mild right foraminal stenosis. 6. At T4-5 and T7-8 there are small left paracentral disc protrusions without foraminal or central canal stenosis. Electronically Signed   By: Kathreen Devoid   On: 09/28/2016 11:41   Mr Thoracic Spine Wo Contrast  Result Date: 09/28/2016 CLINICAL DATA:  Severe  spine and upper back pain that started last PM but got much worse this AM. Patient states the pain has made her arms feel weak and numb. EXAM: MRI CERVICAL AND THORACIC SPINE WITHOUT CONTRAST TECHNIQUE: Multiplanar and multiecho pulse sequences of the cervical and thoracic spine were obtained without intravenous contrast. COMPARISON:  08/15/2010 FINDINGS: MRI CERVICAL SPINE FINDINGS Alignment: Physiologic. Vertebrae: No fracture, evidence of discitis, or bone lesion. Cord: Normal signal and morphology. Posterior Fossa, vertebral arteries, paraspinal tissues: Posterior fossa demonstrates no focal abnormality. Vertebral artery flow voids are maintained. Paraspinal soft tissues are unremarkable. Disc levels: Discs: Disc spaces are maintained. C2-3: No significant disc bulge. No neural foraminal stenosis. No central canal stenosis. C3-4: Minimal broad-based disc bulge. Mild right foraminal stenosis. No central canal stenosis. C4-5: No significant disc bulge. No neural foraminal stenosis. No central canal stenosis. C5-6: Small left paracentral disc protrusion. Mild left foraminal stenosis. No central canal stenosis. C6-7: Mild broad-based disc bulge. Moderate left foraminal stenosis. No right foraminal stenosis. No neural foraminal stenosis. No central canal stenosis. C7-T1: No significant disc bulge. No neural foraminal stenosis. No central canal stenosis. MRI THORACIC SPINE FINDINGS Segmentation:  Standard. Alignment:  Physiologic. Vertebrae:  No fracture, evidence of discitis, or bone lesion. Cord: Normal signal and morphology. Paraspinal and other soft tissues: No paraspinal abnormality. Small 10 mm T2 hyperintense right posterior hepatic mass most consistent with a small cyst. Disc levels: Disc spaces: Degenerative disc disease mild disc height loss at T4-5, T5-6 and T6-7. T1-T2: No disc protrusion.  No foraminal or central canal stenosis. T2-T3: No disc protrusion.  No foraminal or central canal stenosis. T3-T4: No  disc protrusion.  No foraminal or central canal stenosis. T4-T5: Small left paracentral disc protrusion. No foraminal or central canal stenosis. T5-T6: Mild broad-based disc bulge. No foraminal or central canal stenosis. T6-T7: Mild broad-based disc bulge. No foraminal or central canal stenosis. T7-T8: Small left paracentral disc protrusion. No foraminal or central canal stenosis. T8-T9: No disc protrusion.  No foraminal or central canal stenosis. T9-T10: No disc protrusion.  No foraminal or central canal stenosis. T10-T11: No disc protrusion. No foraminal or central canal stenosis. T11-T12: No disc protrusion. No foraminal or central canal stenosis. IMPRESSION: 1.  No acute osseous injury of  the cervical spine. 2.  No acute osseous injury of the thoracic spine. 3. At C5-6 there is a small left paracentral disc protrusion. Mild left foraminal stenosis. 4. At C6-7 there is a mild broad-based disc bulge with moderate left foraminal stenosis. 5. At C3-4 there is minimal broad-based disc bulge and mild right foraminal stenosis. 6. At T4-5 and T7-8 there are small left paracentral disc protrusions without foraminal or central canal stenosis. Electronically Signed   By: Kathreen Devoid   On: 09/28/2016 11:41    ____________________________________________   PROCEDURES  Critical Care performed: No   Procedure(s) performed:   Procedures   ____________________________________________   INITIAL IMPRESSION / ASSESSMENT AND PLAN / ED COURSE  Pertinent labs & imaging results that were available during my care of the patient were reviewed by me and considered in my medical decision making (see chart for details).  must consider all the acute and emergent causes of the type of pain she describes.  While I considered aortic pathology on my differential, the pain seems to be very specific to her back and radiating around in a dermatomal pattern to suggest more thoracic back spasms and radiculopathy instead of  aortic dissection.  It is possible she may have been suffering from discitis/osteomyelitis for an extended period of time, particularly given her report of night sweats, and although this is less likely than bulging disks and radiculopathy, it will be evaluated by MRI thoracic and cervical spine.  She had a lumbar MRI within the last couple of years.  Because the area of concern today is the C and T-spine, I have ordered a noncontrasted MR of these regions and will check basic labs and provide morphine and Toradol for her pain with frequent reassessment.   Clinical Course as of Sep 29 1226  Sat Sep 28, 2016  4098 Mild leukocytosis, uncertain significance at this time, likely stress reaction to pain WBC: (!) 16.3 [CF]  1209 I reviewed the patient's prescription history over the last 12 months in the multi-state controlled substances database(s) that includes [place], [place], [place], [place], [place], [place], [place], [place], [place], [place], [place], [place], [place], and [place].  The patient has filled no controlled substances during that time.   [CF]  1191 the patient feels much better now although she "feels like the pain might come back".  Her MRI did not show any acute abnormalities although she does have several areas of herniated disks.  I reviewed the records by Dr. Sharlet Lawrence and discussed the tramadol and Flexeril with the patient.  It turns out she has not been taking the tramadol and the Flexeril only intermittently.  I encouraged her to take both of them according to Dr. Saralyn Pilar recommendations.  Now that we have improved the acute pain she should be able to follow-up as an outpatient and she told me she has a follow-up visit in 2 days with Dr. Sharlet Lawrence. I am giving a dose of Decadron 10 mg IV which hopefully will help her back pain as well as 2 Percocet here and then encouraged her to take her regular medications at home.  She understands and agrees  with the plan.  [CF]  1228 of note, the patient's leukocytosis is likely because she has been taking prednisone over the last few days  [CF]    Clinical Course User Index [CF] Hinda Kehr, MD    ____________________________________________  FINAL CLINICAL IMPRESSION(S) / ED DIAGNOSES  Final diagnoses:  Acute left-sided thoracic back pain  Thoracic radiculopathy     MEDICATIONS GIVEN DURING THIS VISIT:  Medications  dexamethasone (DECADRON) injection 10 mg (not administered)  oxyCODONE-acetaminophen (PERCOCET/ROXICET) 5-325 MG per tablet 2 tablet (not administered)  ondansetron (ZOFRAN) injection 4 mg (not administered)  morphine 4 MG/ML injection 4 mg (4 mg Intravenous Given 09/28/16 0929)  ondansetron (ZOFRAN) injection 4 mg (4 mg Intravenous Given 09/28/16 0929)  ketorolac (TORADOL) 30 MG/ML injection 15 mg (15 mg Intravenous Given 09/28/16 0929)     NEW OUTPATIENT MEDICATIONS STARTED DURING THIS VISIT:  New Prescriptions   No medications on file    Modified Medications   No medications on file    Discontinued Medications   No medications on file     Note:  This document was prepared using Dragon voice recognition software and may include unintentional dictation errors.    Hinda Kehr, MD 09/28/16 1228

## 2016-09-28 NOTE — ED Notes (Signed)
Pt on the phone with MRI.

## 2016-10-16 NOTE — Telephone Encounter (Signed)
Error

## 2016-10-18 ENCOUNTER — Telehealth: Payer: Self-pay | Admitting: Internal Medicine

## 2016-10-18 NOTE — Telephone Encounter (Signed)
Pt called and stated that she had received a no show fee for 10/27/15. Pt states that her step father had a stroke and was tending to him.

## 2016-10-18 NOTE — Telephone Encounter (Signed)
Please advise, thanks.

## 2016-10-23 NOTE — Telephone Encounter (Signed)
Patient's information has been sent to charge correction for removal of no show fee.

## 2017-02-28 ENCOUNTER — Encounter: Payer: Self-pay | Admitting: Certified Nurse Midwife

## 2017-02-28 ENCOUNTER — Other Ambulatory Visit: Payer: Self-pay | Admitting: Certified Nurse Midwife

## 2017-02-28 ENCOUNTER — Ambulatory Visit (INDEPENDENT_AMBULATORY_CARE_PROVIDER_SITE_OTHER): Admitting: Certified Nurse Midwife

## 2017-02-28 ENCOUNTER — Ambulatory Visit (INDEPENDENT_AMBULATORY_CARE_PROVIDER_SITE_OTHER)

## 2017-02-28 VITALS — BP 140/86 | HR 82 | Ht 67.0 in | Wt 142.2 lb

## 2017-02-28 DIAGNOSIS — R102 Pelvic and perineal pain: Secondary | ICD-10-CM

## 2017-02-28 MED ORDER — NORETHIN ACE-ETH ESTRAD-FE 1-20 MG-MCG PO TABS: 1.0000 | ORAL_TABLET | Freq: Every day | ORAL | 11 refills | Status: DC

## 2017-02-28 NOTE — Progress Notes (Signed)
GYN ENCOUNTER NOTE  Subjective:       Debbie Lawrence is a 52 y.o. G63P0 female is here for gynecologic evaluation of the following issues:  1.Significant pelvic pain with her cycle for the past 3 months.  Pt states that she had an ablation & fibroids removed in 2015 due to heavy menstrual periods. She has continued to spot since her ablation. May -October 2018 she had no period along with hot flashes. She then had her period November-February . Spotting 5-7 days with back pain and cramps 8/10  That have woke her from sleep.    Gynecologic History Patient's last menstrual period was 02/27/2017 (exact date). Contraception: none   Obstetric History OB History  Gravida Para Term Preterm AB Living  2            SAB TAB Ectopic Multiple Live Births               # Outcome Date GA Lbr Len/2nd Weight Sex Delivery Anes PTL Lv  2 Gravida 2005    F Vag-Spont     1 Gravida 1990    F Vag-Spont         Past Medical History:  Diagnosis Date  . Degenerative cervical spinal stenosis    occupational, hari dresser  . Fibromyalgia     Past Surgical History:  Procedure Laterality Date  . ablation    . BREAST BIOPSY Right 2010?   neg  . BREAST BIOPSY Left 2015   neg fibroadenoma    Current Outpatient Medications on File Prior to Visit  Medication Sig Dispense Refill  . Ascorbic Acid (VITAMIN C) 100 MG tablet Take 100 mg by mouth daily.    . Cranberry Extract 250 MG TABS Take 1 tablet by mouth 2 (two) times daily at 10 AM and 5 PM. 180 each 2  . cyclobenzaprine (FLEXERIL) 5 MG tablet Take 5 mg by mouth 3 (three) times daily as needed for muscle spasms.    Marland Kitchen lactobacillus acidophilus (BACID) TABS tablet Take 2 tablets by mouth 2 (two) times daily.    . Multiple Vitamin (MULTI-VITAMINS) TABS Take by mouth.    . nortriptyline (PAMELOR) 10 MG capsule   11  . omega-3 acid ethyl esters (LOVAZA) 1 g capsule Take by mouth 2 (two) times daily.    . traMADol (ULTRAM) 50 MG tablet Take 1 tablet (50 mg  total) by mouth every 6 (six) hours as needed. 90 tablet 5   No current facility-administered medications on file prior to visit.     Allergies  Allergen Reactions  . Vicodin [Hydrocodone-Acetaminophen]   . Sulfa Antibiotics Rash    Social History   Socioeconomic History  . Marital status: Married    Spouse name: Not on file  . Number of children: Not on file  . Years of education: Not on file  . Highest education level: Not on file  Social Needs  . Financial resource strain: Not on file  . Food insecurity - worry: Not on file  . Food insecurity - inability: Not on file  . Transportation needs - medical: Not on file  . Transportation needs - non-medical: Not on file  Occupational History  . Occupation: hairdresser  Tobacco Use  . Smoking status: Former Smoker    Years: 15.00    Types: Cigarettes    Last attempt to quit: 06/12/2011    Years since quitting: 5.7  . Smokeless tobacco: Never Used  Substance and Sexual Activity  . Alcohol  use: No  . Drug use: No  . Sexual activity: Yes    Birth control/protection: None  Other Topics Concern  . Not on file  Social History Narrative  . Not on file    Family History  Problem Relation Age of Onset  . Hypertension Mother   . Hypertension Father   . Cancer Sister        breast  . Breast cancer Sister     The following portions of the patient's history were reviewed and updated as appropriate: allergies, current medications, past family history, past medical history, past social history, past surgical history and problem list.  Review of Systems Review of Systems - Negative except as mentioned in HPI Review of Systems - General ROS: negative for - chills, fatigue, fever, hot flashes, malaise or night sweats Hematological and Lymphatic ROS: negative for - bleeding problems or swollen lymph nodes Gastrointestinal ROS: negative for - abdominal pain, blood in stools, change in bowel habits and  nausea/vomiting Musculoskeletal ROS: negative for - joint pain, muscle pain or muscular weakness Genito-Urinary ROS: negative for - change in menstrual cycle, dysmenorrhea, dyspareunia, dysuria, genital discharge, genital ulcers, hematuria, incontinence, irregular/heavy menses, nocturia or Positive for pelvic pain with cycle   Objective:   BP 140/86   Pulse 82   Ht 5\' 7"  (1.702 m)   Wt 142 lb 4 oz (64.5 kg)   LMP 02/27/2017 (Exact Date)   BMI 22.28 kg/m  CONSTITUTIONAL: Well-developed, well-nourished female in no acute distress.  HENT:  Normocephalic, atraumatic.  NECK: Normal range of motion, supple, no masses.  Normal thyroid.  SKIN: Skin is warm and dry. No rash noted. Not diaphoretic. No erythema. No pallor. [place]: Alert and oriented to person, place, and time. PSYCHIATRIC: Normal mood and affect. Normal behavior. Normal judgment and thought content. CARDIOVASCULAR:Not Examined RESPIRATORY: Not Examined BREASTS: Not Examined ABDOMEN: Soft, non distended; Non tender.  No Organomegaly. PELVIC:Not indicated MUSCULOSKELETAL: Normal range of motion. No tenderness.  No cyanosis, clubbing, or edema.  ULTRASOUND REPORT  Location: [facility] Date of Service:  02/28/2017   Indications: Severe cramping and spotting (Ablation 2015) Findings:  The uterus measures 9.7 x 5.7 x 4.4 cm. Echo texture is homogeneous without evidence of focal masses. Within the uterus is a single suspected fibroid measuring: Fibroid 1: 2.6 x 2.2 x 2.2 cm, Left anterior, intramural (possibly abutting endometrium) The Endometrium measures 4.9 mm.  Right Ovary measures 2.2 x 1.5 x 1.2 cm. It is normal in appearance. Left Ovary measures 1.9 x 1.3 x 1.1 cm. It is normal appearance. Survey of the adnexa demonstrates no adnexal masses. There is no free fluid in the cul de sac.  Impression: 1. Anteverted uterus appears of normal contour. 2. A single anterior, intramural fibroid is noted within  the left uterus; possibly abutting the endometrium. 3. Endometrium measures 4.9 mm. 4. Bilateral ovaries appear WNL.  Recommendations: 1.Clinical correlation with the patient's History and Physical Exam.   Dario Ave, RDMS   Assessment:   Uterine Fibroid   Plan:   Reviewed Fibroids and treatment options Reviewed use of birth control , IUD, Lupron, and surgery. Pt request to try birth control. Order placed for Loestrin with instructions for use. She denies contraindications for birth control use.  She verbalizes understanding and agrees to plan .   I attest more than 50% of visit spent reviewing HPI, reviewing and discussing U/s results. Discussing uterine fibroid and treatment option. We also discussed use of birth control risks  and benefits.Face to face time 15 minutes.   Philip Aspen, CNM

## 2017-02-28 NOTE — Patient Instructions (Signed)
Uterine Fibroids Uterine fibroids are tissue masses (tumors). They are also called leiomyomas. They can develop inside of a woman's womb (uterus). They can grow very large. Fibroids are not cancerous (benign). Most fibroids do not require medical treatment. Follow these instructions at home:  Keep all follow-up visits as told by your doctor. This is important.  Take medicines only as told by your doctor. ? If you were prescribed a hormone treatment, take the hormone medicines exactly as told. ? Do not take aspirin. It can cause bleeding.  Ask your doctor about taking iron pills and increasing the amount of dark green, leafy vegetables in your diet. These actions can help to boost your blood iron levels.  Pay close attention to your period. Tell your doctor about any changes, such as: ? Increased blood flow. This may require you to use more pads or tampons than usual per month. ? A change in the number of days that your period lasts per month. ? A change in symptoms that come with your period, such as back pain or cramping in your belly area (abdomen). Contact a doctor if:  You have pain in your back or the area between your hip bones (pelvic area) that is not controlled by medicines.  You have pain in your abdomen that is not controlled with medicines.  You have an increase in bleeding between and during periods.  You soak tampons or pads in a half hour or less.  You feel lightheaded.  You feel extra tired.  You feel weak. Get help right away if:  You pass out (faint).  You have a sudden increase in pelvic pain. This information is not intended to replace advice given to you by your health care provider. Make sure you discuss any questions you have with your health care provider. Document Released: 02/09/2010 Document Revised: 09/08/2015 Document Reviewed: 07/06/2013 Elsevier Interactive Patient Education  2018 Elsevier Inc.  

## 2017-02-28 NOTE — Progress Notes (Signed)
Pt is here with c/o PMB. Started bleeding yesterday with cramping and pain and backache.

## 2017-03-28 ENCOUNTER — Encounter: Payer: Self-pay | Admitting: Certified Nurse Midwife

## 2017-03-28 ENCOUNTER — Other Ambulatory Visit: Payer: Self-pay

## 2017-03-28 MED ORDER — NORETHIN ACE-ETH ESTRAD-FE 1-20 MG-MCG PO TABS: 1.0000 | ORAL_TABLET | Freq: Every day | ORAL | 9 refills | Status: DC

## 2017-03-31 ENCOUNTER — Other Ambulatory Visit: Payer: Self-pay

## 2017-03-31 MED ORDER — NORETHIN ACE-ETH ESTRAD-FE 1-20 MG-MCG PO TABS: 1.0000 | ORAL_TABLET | Freq: Every day | ORAL | 9 refills | Status: DC

## 2017-11-26 ENCOUNTER — Other Ambulatory Visit: Payer: Self-pay

## 2017-11-26 MED ORDER — NORETHIN ACE-ETH ESTRAD-FE 1-20 MG-MCG PO TABS: 1.0000 | ORAL_TABLET | Freq: Every day | ORAL | 0 refills | Status: DC

## 2017-11-26 NOTE — Telephone Encounter (Signed)
Refill sent per pt request.  

## 2018-01-26 ENCOUNTER — Other Ambulatory Visit: Payer: Self-pay | Admitting: Obstetrics and Gynecology

## 2018-01-26 ENCOUNTER — Other Ambulatory Visit: Payer: Self-pay | Admitting: Physician Assistant

## 2018-01-26 DIAGNOSIS — Z1231 Encounter for screening mammogram for malignant neoplasm of breast: Secondary | ICD-10-CM

## 2018-02-16 ENCOUNTER — Encounter

## 2018-02-27 ENCOUNTER — Ambulatory Visit
Admission: RE | Admit: 2018-02-27 | Discharge: 2018-02-27 | Disposition: A | Discharge status: Home / Self Care | Source: Ambulatory Visit | Attending: Physician Assistant | Admitting: Physician Assistant

## 2018-02-27 DIAGNOSIS — Z1231 Encounter for screening mammogram for malignant neoplasm of breast: Secondary | ICD-10-CM | POA: Insufficient documentation

## 2018-04-15 ENCOUNTER — Encounter: Admitting: Obstetrics and Gynecology

## 2018-06-10 ENCOUNTER — Encounter: Admitting: Obstetrics and Gynecology

## 2018-07-21 ENCOUNTER — Other Ambulatory Visit: Payer: Self-pay | Admitting: Obstetrics and Gynecology

## 2018-07-21 MED ORDER — NORETHINDRONE 0.35 MG PO TABS: 1.0000 | ORAL_TABLET | Freq: Every day | ORAL | 2 refills | Status: DC

## 2018-08-06 ENCOUNTER — Encounter: Payer: Self-pay | Admitting: Obstetrics and Gynecology

## 2018-08-06 ENCOUNTER — Ambulatory Visit (INDEPENDENT_AMBULATORY_CARE_PROVIDER_SITE_OTHER): Admitting: Obstetrics and Gynecology

## 2018-08-06 ENCOUNTER — Other Ambulatory Visit: Payer: Self-pay

## 2018-08-06 VITALS — BP 111/67 | HR 75 | Ht 66.5 in | Wt 147.1 lb

## 2018-08-06 DIAGNOSIS — N921 Excessive and frequent menstruation with irregular cycle: Secondary | ICD-10-CM

## 2018-08-06 DIAGNOSIS — Z01419 Encounter for gynecological examination (general) (routine) without abnormal findings: Secondary | ICD-10-CM

## 2018-08-06 DIAGNOSIS — D251 Intramural leiomyoma of uterus: Secondary | ICD-10-CM | POA: Diagnosis not present

## 2018-08-06 NOTE — Progress Notes (Signed)
Subjective:   Debbie Lawrence is a 53 y.o. G18P0 Caucasian female here for a routine well-woman exam.  No LMP recorded. Patient has had an ablation.    Current complaints: feels like hormones are better since starting new pills, h/o fibroids last check PCP: Carrie Mew       does desire labs  Social History: Sexual: heterosexual Marital Status: married Living situation: with spouse Occupation: self-employed Tobacco/alcohol: no tobacco use Illicit drugs: no history of illicit drug use  The following portions of the patient's history were reviewed and updated as appropriate: allergies, current medications, past family history, past medical history, past social history, past surgical history and problem list.  Past Medical History Past Medical History:  Diagnosis Date  . Degenerative cervical spinal stenosis    occupational, hari dresser  . Fibromyalgia     Past Surgical History Past Surgical History:  Procedure Laterality Date  . ablation    . BREAST BIOPSY Right 2010?   neg  . BREAST BIOPSY Left 2015   neg fibroadenoma    Gynecologic History G2P0  No LMP recorded. Patient has had an ablation. Contraception: post menopausal status Last Pap: 2017. Results were: normal Last mammogram: 02/2018. Results were: normal   Obstetric History OB History  Gravida Para Term Preterm AB Living  2            SAB TAB Ectopic Multiple Live Births               # Outcome Date GA Lbr Len/2nd Weight Sex Delivery Anes PTL Lv  2 Gravida 2005    F Vag-Spont     1 Gravida 1990    F Vag-Spont       Current Medications Current Outpatient Medications on File Prior to Visit  Medication Sig Dispense Refill  . Ascorbic Acid (VITAMIN C) 100 MG tablet Take 100 mg by mouth daily.    . Cranberry Extract 250 MG TABS Take 1 tablet by mouth 2 (two) times daily at 10 AM and 5 PM. 180 each 2  . cyclobenzaprine (FLEXERIL) 5 MG tablet Take 5 mg by mouth 3 (three) times daily as needed for muscle spasms.     Marland Kitchen lactobacillus acidophilus (BACID) TABS tablet Take 2 tablets by mouth 2 (two) times daily.    . Multiple Vitamin (MULTI-VITAMINS) TABS Take by mouth.    . norethindrone (MICRONOR) 0.35 MG tablet Take 1 tablet (0.35 mg total) by mouth daily. 1 Package 2  . omega-3 acid ethyl esters (LOVAZA) 1 g capsule Take by mouth 2 (two) times daily.    . SUMAtriptan (IMITREX) 100 MG tablet Take 100 mg by mouth every 2 (two) hours as needed for migraine. May repeat in 2 hours if headache persists or recurs.    . traMADol (ULTRAM) 50 MG tablet Take 1 tablet (50 mg total) by mouth every 6 (six) hours as needed. 90 tablet 5  . nortriptyline (PAMELOR) 10 MG capsule   11   No current facility-administered medications on file prior to visit.     Review of Systems Patient denies any headaches, blurred vision, shortness of breath, chest pain, abdominal pain, problems with bowel movements, urination, or intercourse.  Objective:  BP 111/67   Pulse 75   Ht 5' 6.5" (1.689 m)   Wt 147 lb 1.6 oz (66.7 kg)   BMI 23.39 kg/m  Physical Exam  General:  Well developed, well nourished, no acute distress. She is alert and oriented x3. Skin:  Warm and dry  Neck:  Midline trachea, no thyromegaly or nodules Cardiovascular: Regular rate and rhythm, no murmur heard Lungs:  Effort normal, all lung fields clear to auscultation bilaterally Breasts:  No dominant palpable mass, retraction, or nipple discharge Abdomen:  Soft, non tender, no hepatosplenomegaly or masses Pelvic:  External genitalia is normal in appearance.  The vagina is normal in appearance. The cervix is bulbous, no CMT.  Thin prep pap is not done . Uterus is felt to be normal size, shape, and contour.  No adnexal masses or tenderness noted. Extremities:  No swelling or varicosities noted Psych:  She has a normal mood and affect  Assessment:   Healthy well-woman exam Uterine fibroid (not new) Irregular spotting  Plan:  Labs obtained-will follow up  accordingly Will continue with current pills and repeat pelvic ultrasound next year or sooner if needed.  F/U 1 year for AE, or sooner if needed Mammogram UTD Colonoscopy  UTD   Rockney Ghee, CNM

## 2018-08-07 LAB — CBC
Hematocrit: 38.7 % (ref 34.0–46.6)
Hemoglobin: 13.5 g/dL (ref 11.1–15.9)
MCH: 32.7 pg (ref 26.6–33.0)
MCHC: 34.9 g/dL (ref 31.5–35.7)
MCV: 94 fL (ref 79–97)
Platelets: 304 10*3/uL (ref 150–450)
RBC: 4.13 x10E6/uL (ref 3.77–5.28)
RDW: 12.4 % (ref 11.7–15.4)
WBC: 7.9 10*3/uL (ref 3.4–10.8)

## 2018-08-07 LAB — HEMOGLOBIN A1C
Est. average glucose Bld gHb Est-mCnc: 97 mg/dL
Hgb A1c MFr Bld: 5 % (ref 4.8–5.6)

## 2018-08-07 LAB — VITAMIN D 25 HYDROXY (VIT D DEFICIENCY, FRACTURES): Vit D, 25-Hydroxy: 58 ng/mL (ref 30.0–100.0)

## 2018-08-07 LAB — COMPREHENSIVE METABOLIC PANEL
ALT: 12 IU/L (ref 0–32)
AST: 19 IU/L (ref 0–40)
Albumin/Globulin Ratio: 2.1 (ref 1.2–2.2)
Albumin: 4.9 g/dL (ref 3.8–4.9)
Alkaline Phosphatase: 81 IU/L (ref 39–117)
BUN/Creatinine Ratio: 18 (ref 9–23)
BUN: 17 mg/dL (ref 6–24)
Bilirubin Total: 0.4 mg/dL (ref 0.0–1.2)
CO2: 24 mmol/L (ref 20–29)
Calcium: 10.2 mg/dL (ref 8.7–10.2)
Chloride: 102 mmol/L (ref 96–106)
Creatinine, Ser: 0.92 mg/dL (ref 0.57–1.00)
GFR calc Af Amer: 83 mL/min/{1.73_m2} (ref >59)
GFR calc non Af Amer: 72 mL/min/{1.73_m2} (ref >59)
Globulin, Total: 2.3 g/dL (ref 1.5–4.5)
Glucose: 75 mg/dL (ref 65–99)
Potassium: 4.5 mmol/L (ref 3.5–5.2)
Sodium: 142 mmol/L (ref 134–144)
Total Protein: 7.2 g/dL (ref 6.0–8.5)

## 2018-08-07 LAB — LIPID PANEL
Chol/HDL Ratio: 2.7 ratio (ref 0.0–4.4)
Cholesterol, Total: 175 mg/dL (ref 100–199)
HDL: 65 mg/dL (ref >39)
LDL Calculated: 89 mg/dL (ref 0–99)
Triglycerides: 105 mg/dL (ref 0–149)
VLDL Cholesterol Cal: 21 mg/dL (ref 5–40)

## 2018-08-07 LAB — FOLLICLE STIMULATING HORMONE: FSH: 67.1 m[IU]/mL

## 2018-08-07 LAB — TSH: TSH: 2.7 u[IU]/mL (ref 0.450–4.500)

## 2018-09-16 ENCOUNTER — Telehealth: Payer: Self-pay | Admitting: Obstetrics and Gynecology

## 2018-09-16 NOTE — Telephone Encounter (Signed)
pls advise

## 2018-09-16 NOTE — Telephone Encounter (Signed)
The patient called and stated that she has been seen for hormone replacement and has been showing signs that confirms menopause. Pt is requesting a prescription to be sent into her pharmacy to help with symptoms. Please advise.

## 2018-09-23 NOTE — Telephone Encounter (Signed)
Pt went to PCP

## 2018-09-23 NOTE — Telephone Encounter (Signed)
Not a problem, please let me know if you prefer a pill, patch or cream?

## 2018-10-05 ENCOUNTER — Telehealth: Payer: Self-pay | Admitting: Obstetrics and Gynecology

## 2018-10-05 NOTE — Telephone Encounter (Signed)
Patient called stating she would like to switch from prempro to a patch. Whichever patch you would recommend would be fine per patient.Thanks

## 2018-10-05 NOTE — Telephone Encounter (Signed)
pls advise

## 2018-10-06 ENCOUNTER — Other Ambulatory Visit: Payer: Self-pay | Admitting: Obstetrics and Gynecology

## 2018-10-06 MED ORDER — COMBIPATCH 0.05-0.14 MG/DAY TD PTTW: 1.0000 | MEDICATED_PATCH | TRANSDERMAL | 12 refills | Status: DC

## 2018-10-06 NOTE — Telephone Encounter (Signed)
combipatch sent in

## 2018-10-09 ENCOUNTER — Telehealth: Payer: Self-pay | Admitting: Obstetrics and Gynecology

## 2018-10-09 NOTE — Telephone Encounter (Signed)
The patient called and stated that she has not been able to get her prescription from 3 pharmacy's patient is requesting her prescription estradiol-norethindrone [facility]) 0.05-0.14 MG/DAY [23882] be sent/faxed to [facility] (Mail order) there fax number is [phone]. Please advise.

## 2018-10-13 ENCOUNTER — Other Ambulatory Visit: Payer: Self-pay | Admitting: *Deleted

## 2018-10-13 MED ORDER — COMBIPATCH 0.05-0.14 MG/DAY TD PTTW: 1.0000 | MEDICATED_PATCH | TRANSDERMAL | 12 refills | Status: DC

## 2018-10-13 NOTE — Telephone Encounter (Signed)
Done-ac 

## 2019-04-07 ENCOUNTER — Other Ambulatory Visit: Payer: Self-pay | Admitting: Physician Assistant

## 2019-04-07 DIAGNOSIS — Z1231 Encounter for screening mammogram for malignant neoplasm of breast: Secondary | ICD-10-CM

## 2019-05-05 ENCOUNTER — Ambulatory Visit
Admission: RE | Admit: 2019-05-05 | Discharge: 2019-05-05 | Disposition: A | Discharge status: Home / Self Care | Payer: Medicare Other | Source: Ambulatory Visit | Attending: Physician Assistant | Admitting: Physician Assistant

## 2019-05-05 DIAGNOSIS — Z1231 Encounter for screening mammogram for malignant neoplasm of breast: Secondary | ICD-10-CM | POA: Diagnosis not present

## 2019-05-11 ENCOUNTER — Telehealth: Payer: Self-pay

## 2019-05-11 ENCOUNTER — Telehealth: Payer: Self-pay | Admitting: Certified Nurse Midwife

## 2019-05-11 NOTE — Telephone Encounter (Signed)
mychart message sent to patient

## 2019-05-11 NOTE — Telephone Encounter (Signed)
Patient called in saying she is having an allergic reaction to her [facility] prescription. Could you please advise?

## 2019-05-12 ENCOUNTER — Other Ambulatory Visit: Payer: Self-pay | Admitting: Certified Nurse Midwife

## 2019-05-12 MED ORDER — PREMPRO 0.625-5 MG PO TABS: 1.0000 | ORAL_TABLET | Freq: Every day | ORAL | 3 refills | Status: DC

## 2019-06-02 ENCOUNTER — Telehealth: Payer: Self-pay | Admitting: Certified Nurse Midwife

## 2019-06-02 ENCOUNTER — Other Ambulatory Visit: Payer: Self-pay

## 2019-06-02 MED ORDER — PREMPRO 0.625-5 MG PO TABS: 1.0000 | ORAL_TABLET | Freq: Every day | ORAL | 3 refills | Status: DC

## 2019-06-02 NOTE — Telephone Encounter (Signed)
Patient called in saying she needed her prescription [facility]. Could you please advise?

## 2019-06-02 NOTE — Telephone Encounter (Signed)
[facility]   Pt aware.

## 2019-06-07 ENCOUNTER — Telehealth: Payer: Self-pay | Admitting: Certified Nurse Midwife

## 2019-06-07 NOTE — Telephone Encounter (Signed)
Candy from [employer] called saying they needed clarification on directions for this patients prescription from [facility]. Reference [id number] Call back number - [phone]  Could you please advise?

## 2019-06-09 ENCOUNTER — Other Ambulatory Visit: Payer: Self-pay

## 2019-06-09 MED ORDER — PREMPRO 0.625-5 MG PO TABS: 1.0000 | ORAL_TABLET | Freq: Every day | ORAL | 3 refills | Status: DC

## 2019-06-09 NOTE — Telephone Encounter (Signed)
Pt aware directions updated and [facility]

## 2019-06-09 NOTE — Telephone Encounter (Signed)
The pt called in and stated that she talked to cm last week about her meds. The pt said the order needs to be sent back in it was incomplete, it didn't have the  dose and amount of refills. Please advise [facility] to [facility].

## 2019-06-10 ENCOUNTER — Telehealth: Payer: Self-pay | Admitting: Certified Nurse Midwife

## 2019-06-10 NOTE — Telephone Encounter (Signed)
Message left for patient to return my call.  

## 2019-06-10 NOTE — Telephone Encounter (Signed)
Patient called in saying she noticed her prescription for Prempro the dosage was higher than she is used to and that caused some concern for her. Also, she tried to get the prescription from her local pharmacy rather than using the [facility] but she is having issues with that as well. She would like a nurse to give her a call back at [phone].  Could you please advise?

## 2019-06-11 ENCOUNTER — Other Ambulatory Visit: Payer: Self-pay

## 2019-06-11 ENCOUNTER — Telehealth: Payer: Self-pay | Admitting: Certified Nurse Midwife

## 2019-06-11 MED ORDER — PREMPRO 0.3-1.5 MG PO TABS: 1.0000 | ORAL_TABLET | Freq: Every day | ORAL | 3 refills | Status: DC

## 2019-06-11 NOTE — Telephone Encounter (Signed)
Spoke with patient- prescription for prempro 0.3-1.5mg  [facility] to [facility] per patient request.

## 2019-06-11 NOTE — Telephone Encounter (Signed)
Patient is returning missed call from nurse

## 2019-06-14 NOTE — Telephone Encounter (Signed)
SS-  It looks like you sent in a lower dose of prempro for this pt. I apologized for the confusion. I erxed the only prempro that I could fine that  AT had rxed. Thanks.  CM

## 2019-06-14 NOTE — Telephone Encounter (Signed)
Is this all taken care of or is there something I need to do?  Thanks  Deneise Lever

## 2019-08-09 ENCOUNTER — Encounter: Payer: Self-pay | Admitting: Certified Nurse Midwife

## 2019-08-10 ENCOUNTER — Encounter

## 2019-09-20 ENCOUNTER — Encounter: Payer: Self-pay | Admitting: Certified Nurse Midwife

## 2019-09-21 ENCOUNTER — Encounter: Payer: Self-pay | Admitting: Certified Nurse Midwife

## 2020-08-23 ENCOUNTER — Other Ambulatory Visit: Payer: Self-pay | Admitting: Physician Assistant

## 2020-08-23 DIAGNOSIS — Z1231 Encounter for screening mammogram for malignant neoplasm of breast: Secondary | ICD-10-CM

## 2020-08-31 ENCOUNTER — Other Ambulatory Visit: Payer: Self-pay

## 2020-08-31 ENCOUNTER — Ambulatory Visit
Admission: RE | Admit: 2020-08-31 | Discharge: 2020-08-31 | Disposition: A | Discharge status: Home / Self Care | Payer: Medicare Other | Source: Ambulatory Visit | Attending: Physician Assistant | Admitting: Physician Assistant

## 2020-08-31 DIAGNOSIS — Z1231 Encounter for screening mammogram for malignant neoplasm of breast: Secondary | ICD-10-CM | POA: Insufficient documentation

## 2021-09-10 ENCOUNTER — Other Ambulatory Visit: Payer: Self-pay | Admitting: Physician Assistant

## 2021-09-10 DIAGNOSIS — Z1231 Encounter for screening mammogram for malignant neoplasm of breast: Secondary | ICD-10-CM

## 2021-09-25 IMAGING — MG MM DIGITAL SCREENING BILAT W/ TOMO AND CAD
8 series · 8 of 24 positions shown · non-contrast
Comparison: Previous exam(s).

CLINICAL DATA: Screening.

EXAM:
DIGITAL SCREENING BILATERAL MAMMOGRAM WITH TOMOSYNTHESIS AND CAD
TECHNIQUE: Bilateral screening digital craniocaudal and mediolateral oblique
mammograms were obtained. Bilateral screening digital breast
tomosynthesis was performed. The images were evaluated with
computer-aided detection.

[L CC synth-2D]
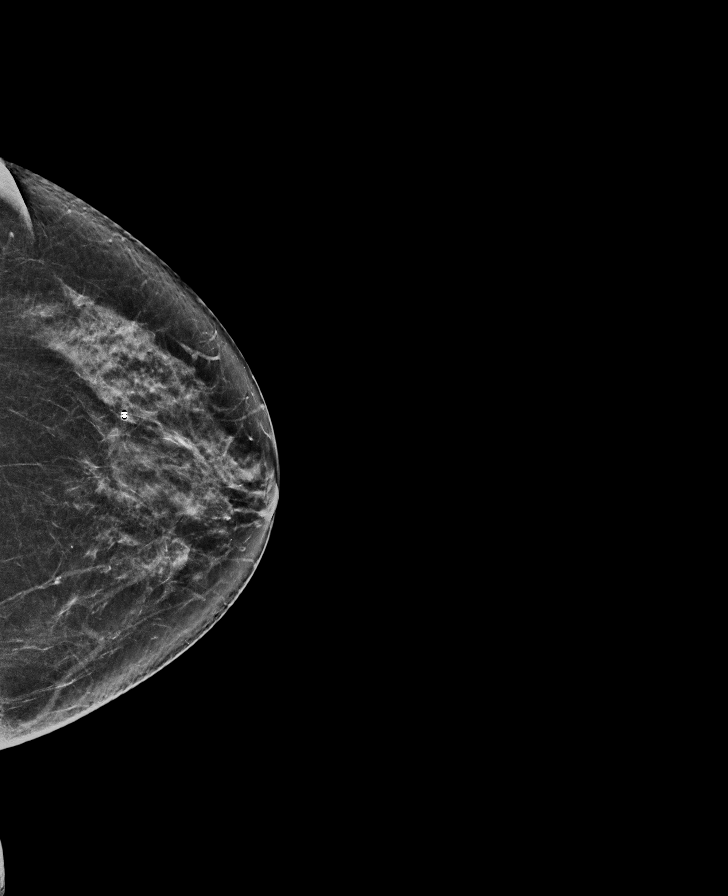

[L MLO synth-2D]
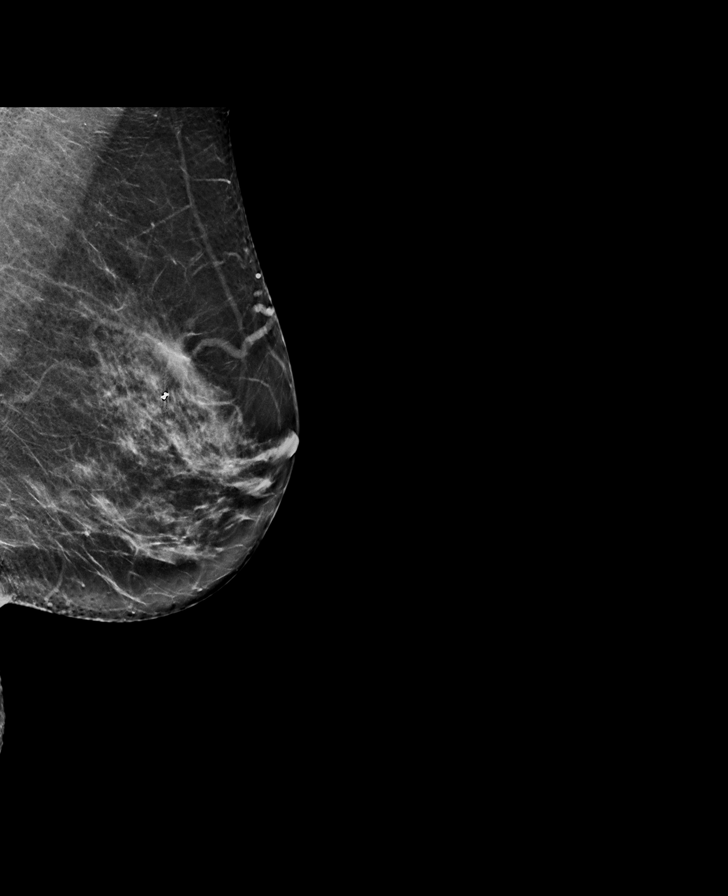

[R CC synth-2D]
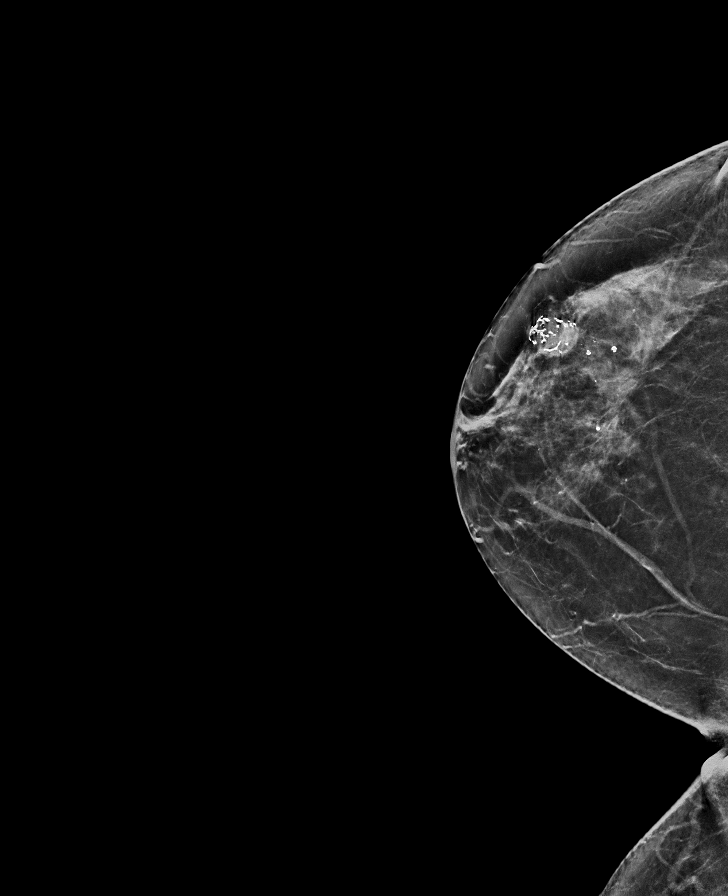

[R MLO synth-2D]
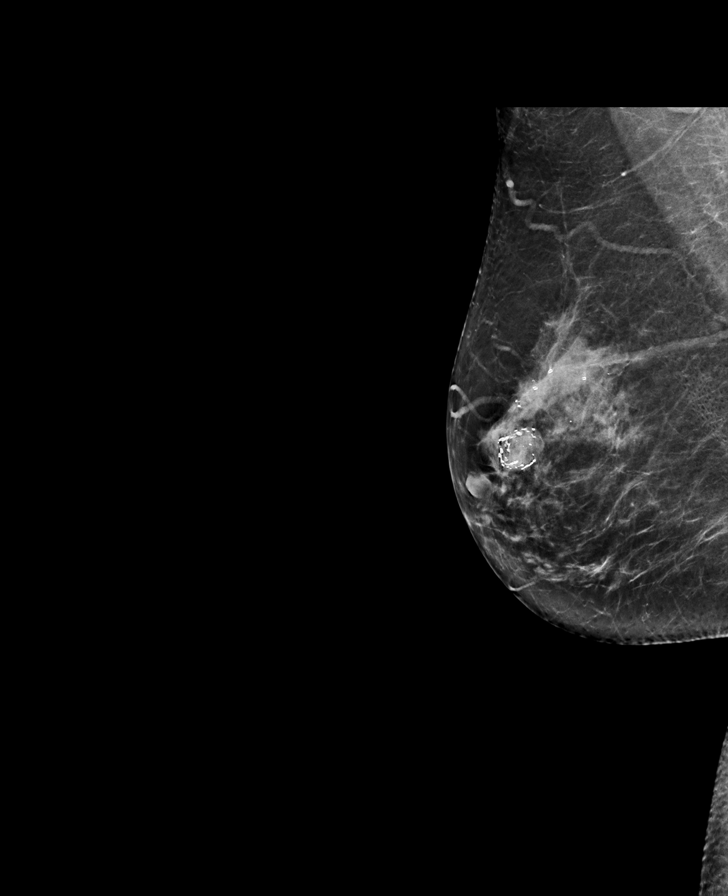

[R MLO tomo · tomo slice 37/73.0]
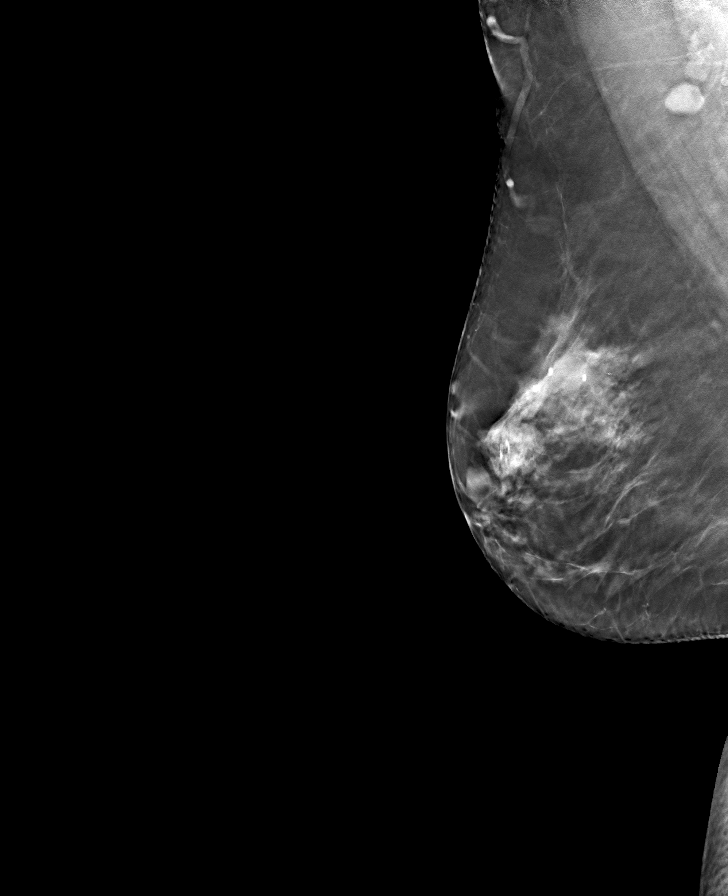

[L CC tomo · tomo slice 33/66.0]
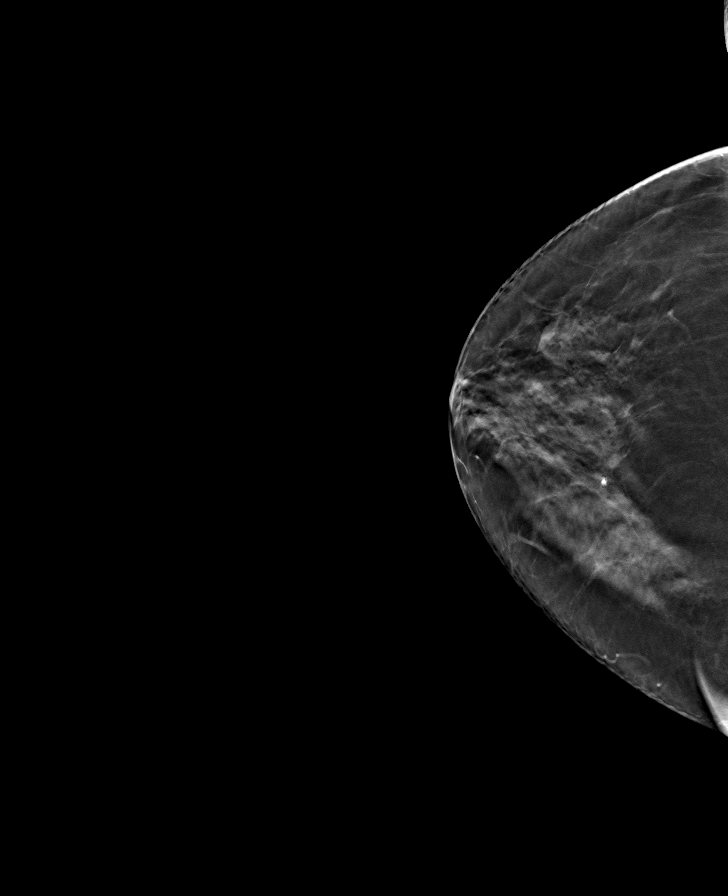

[L MLO tomo · tomo slice 35/68.0]
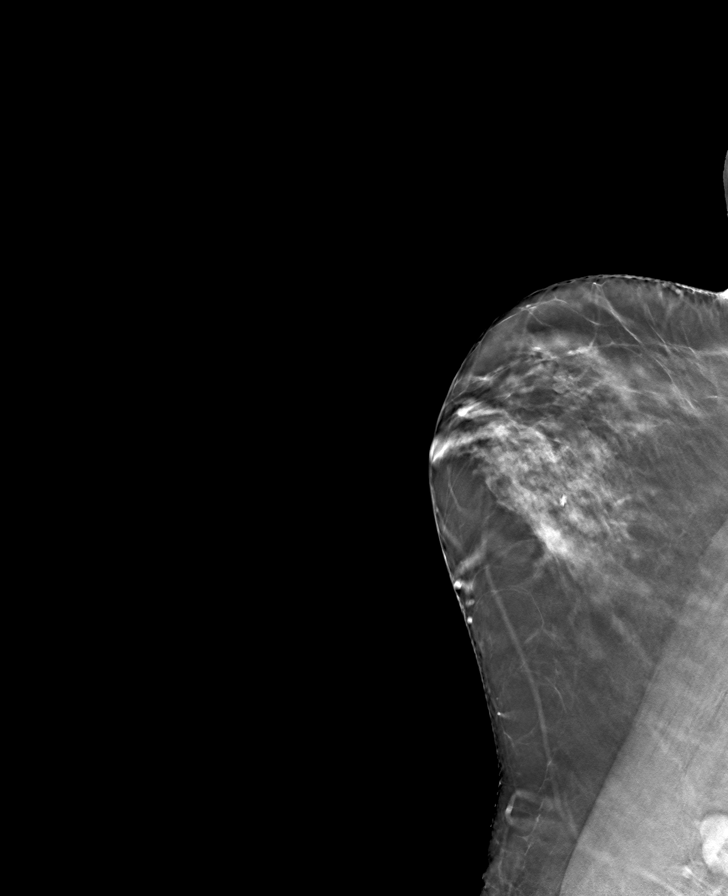

[R CC tomo · tomo slice 33/65.0]
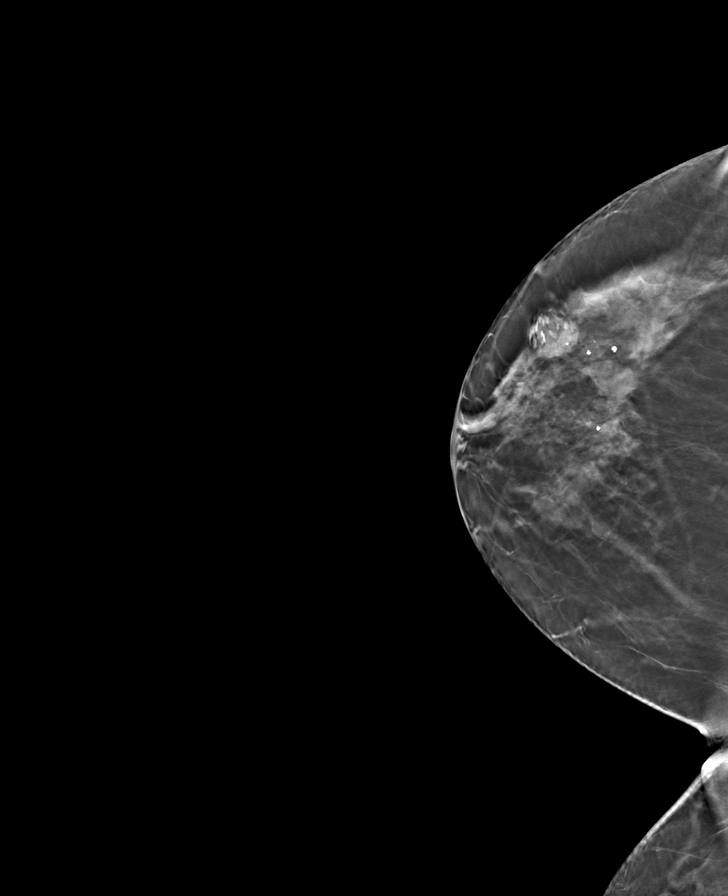

[8 of 24 positions shown; findings below may reference images not displayed]

ACR Breast Density Category c: The breast tissue is heterogeneously
dense, which may obscure small masses.
FINDINGS: There are no findings suspicious for malignancy.
IMPRESSION: No mammographic evidence of malignancy. A result letter of this
screening mammogram will be mailed directly to the patient.

RECOMMENDATION:
Screening mammogram in one year. (Code:Q3-W-BC3)

BI-RADS CATEGORY  1: Negative.

## 2021-11-05 ENCOUNTER — Ambulatory Visit
Admission: RE | Admit: 2021-11-05 | Discharge: 2021-11-05 | Disposition: A | Discharge status: Home / Self Care | Payer: Medicare Other | Source: Ambulatory Visit | Attending: Physician Assistant | Admitting: Physician Assistant

## 2021-11-05 DIAGNOSIS — Z1231 Encounter for screening mammogram for malignant neoplasm of breast: Secondary | ICD-10-CM | POA: Diagnosis present

## 2022-09-24 ENCOUNTER — Other Ambulatory Visit: Payer: Self-pay | Admitting: Physician Assistant

## 2022-09-24 DIAGNOSIS — Z1231 Encounter for screening mammogram for malignant neoplasm of breast: Secondary | ICD-10-CM

## 2023-06-08 ENCOUNTER — Emergency Department
Admission: EM | Admit: 2023-06-08 | Discharge: 2023-06-08 | Disposition: A | Discharge status: Home / Self Care | Attending: Emergency Medicine | Admitting: Emergency Medicine

## 2023-06-08 ENCOUNTER — Other Ambulatory Visit: Payer: Self-pay

## 2023-06-08 DIAGNOSIS — T782XXA Anaphylactic shock, unspecified, initial encounter: Secondary | ICD-10-CM | POA: Insufficient documentation

## 2023-06-08 DIAGNOSIS — T7840XA Allergy, unspecified, initial encounter: Secondary | ICD-10-CM | POA: Diagnosis present

## 2023-06-08 MED ORDER — ONDANSETRON HCL 4 MG/2ML IJ SOLN
4.0000 mg | Freq: Once | INTRAMUSCULAR | Status: AC
  Administered 2023-06-08: 4 mg via INTRAVENOUS
  Filled 2023-06-08: qty 2

## 2023-06-08 MED ORDER — EPINEPHRINE 0.3 MG/0.3ML IJ SOAJ: 0.3000 mg | INTRAMUSCULAR | 0 refills | Status: AC | PRN

## 2023-06-08 MED ORDER — PROCHLORPERAZINE EDISYLATE 10 MG/2ML IJ SOLN
10.0000 mg | Freq: Once | INTRAMUSCULAR | Status: AC
  Administered 2023-06-08: 10 mg via INTRAVENOUS
  Filled 2023-06-08: qty 2

## 2023-06-08 MED ORDER — ONDANSETRON HCL 4 MG/2ML IJ SOLN: 4.0000 mg | Freq: Once | INTRAMUSCULAR | Status: DC

## 2023-06-08 MED ORDER — SODIUM CHLORIDE 0.9 % IV BOLUS
1000.0000 mL | Freq: Once | INTRAVENOUS | Status: AC
  Administered 2023-06-08: 1000 mL via INTRAVENOUS

## 2023-06-08 NOTE — ED Triage Notes (Signed)
 Pt BIB [facility] from Home, reports was stung by a yellow jacket about an hour ago. Has had reactions as a child but never this bad. Pt self administered 50mg  Benadryl po, EMS gave 0.3mg  Epi, 125 solumedrol, 4mg  zofran , 20mg  pepcid. Pt reports having wheezing, nausea and feeling like throat getting tight. Upon arrival pt states no sob, cp. Does say throat still feels swollen.

## 2023-06-08 NOTE — ED Notes (Signed)
 Pt does present with hives on bilateral arms and neck. Pt shaking but states is not cold. RR even and unlabored at this time.

## 2023-06-08 NOTE — ED Provider Notes (Signed)
 [facility] Provider Note    Event Date/Time   First MD Initiated Contact with Patient 06/08/23 1341     (approximate)   History   Chief Complaint Allergic Reaction   HPI  Debbie Lawrence is a 58 y.o. female with past medical history of spinal stenosis and fibromyalgia who presents to the ED complaining of allergic reaction.  Patient reports that about an hour prior to arrival she was raising an awning on her patio when she got stung by a wasp on her left arm.  Shortly afterwards, she developed diffuse hives with some nausea and a feeling of swelling in her throat.  She took 50 mg of oral Benadryl without significant improvement, subsequently contacted EMS.  On EMS arrival, patient was given IM epinephrine as well as IV Solu-Medrol and Pepcid.  Patient reports that symptoms have been improving since then, not currently having any difficulty breathing, does report some slight swelling in her throat but significantly improved from before.  She also describes improvement in hives across her body.  She does report some ongoing nausea, vomited once at home but denies diarrhea.     Physical Exam   Triage Vital Signs: ED Triage Vitals [06/08/23 1342]  Encounter Vitals Group     BP      Systolic BP Percentile      Diastolic BP Percentile      Pulse      Resp      Temp      Temp src      SpO2      Weight 147 lb (66.7 kg)     Height 5\' 7"  (1.702 m)     Head Circumference      Peak Flow      Pain Score 4     Pain Loc      Pain Education      Exclude from Growth Chart     Most recent vital signs: Vitals:   06/08/23 1342 06/08/23 1355  BP:    Pulse:  84  Resp:  18  Temp: 98.2 F (36.8 C)   SpO2:  97%    Constitutional: Alert and oriented. Eyes: Conjunctivae are normal. Head: Atraumatic. Nose: No congestion/rhinnorhea. Mouth/Throat: Mucous membranes are moist.  No significant edema in the posterior oropharynx. Cardiovascular: Normal rate, regular  rhythm. Grossly normal heart sounds.  2+ radial pulses bilaterally. Respiratory: Normal respiratory effort.  No retractions. Lungs CTAB. Gastrointestinal: Soft and nontender. No distention. Musculoskeletal: No lower extremity tenderness nor edema. Hives to bilateral to upper extremities. Neurologic:  Normal speech and language. No gross focal neurologic deficits are appreciated.    ED Results / Procedures / Treatments   Labs (all labs ordered are listed, but only abnormal results are displayed) Labs Reviewed - No data to display   EKG  ED ECG REPORT I, Twilla Galea, the attending physician, personally viewed and interpreted this ECG.   Date: 06/08/2023  EKG Time: 13:42  Rate: 81  Rhythm: normal sinus rhythm  Axis: Normal  Intervals:none  ST&T Change: None  PROCEDURES:  Critical Care performed: No  Procedures   MEDICATIONS ORDERED IN ED: Medications  sodium chloride 0.9 % bolus 1,000 mL (has no administration in time range)  ondansetron  (ZOFRAN ) injection 4 mg (4 mg Intravenous Given 06/08/23 1354)  prochlorperazine (COMPAZINE) injection 10 mg (10 mg Intravenous Given 06/08/23 1407)     IMPRESSION / MDM / ASSESSMENT AND PLAN / ED COURSE  I reviewed the triage  vital signs and the nursing notes.                              58 y.o. female with past medical history of spinal stenosis and fibromyalgia who presents to the ED complaining of diffuse hives, throat swelling, nausea, and vomiting following bee sting about an hour prior to arrival.  Patient's presentation is most consistent with acute presentation with potential threat to life or bodily function.  Differential diagnosis includes, but is not limited to, anaphylaxis, allergic reaction, hives.  Patient nontoxic-appearing and in no acute distress, vital signs are unremarkable.  She does have improving hives to her bilateral upper extremities, complains of ongoing nausea, but throat swelling improved with no  difficulty breathing at this time.  Anaphylaxis seems to be improving overall following IM epinephrine given by EMS.  Patient did begin vomiting despite IV Zofran , will treat with IV Compazine and observe here in the ED.  Nausea improving following IV Compazine.  Patient turned over to oncoming provider pending observation following epinephrine for anaphylaxis.      FINAL CLINICAL IMPRESSION(S) / ED DIAGNOSES   Final diagnoses:  Anaphylaxis, initial encounter     Rx / DC Orders   ED Discharge Orders          Ordered    EPINEPHrine 0.3 mg/0.3 mL IJ SOAJ injection  As needed        06/08/23 1429             Note:  This document was prepared using Dragon voice recognition software and may include unintentional dictation errors.   Twilla Galea, MD 06/08/23 [phone]

## 2023-06-08 NOTE — ED Notes (Signed)
 Pt stating feeling nauseated, emesis bag provided to pt. Dr Cleora Daft made aware

## 2023-06-30 NOTE — Progress Notes (Unsigned)
 ANNUAL PREVENTATIVE CARE GYNECOLOGY  ENCOUNTER NOTE  SUBJECTIVE:       Debbie Lawrence is a 58 y.o. G51P2002 female here for a routine annual gynecologic exam. The patient is sexually active. The patient is taking hormone replacement therapy. Patient denies post-menopausal vaginal bleeding. The patient wears seatbelts: yes. The patient participates in regular exercise: yes. Has the patient ever been transfused or tattooed?: yes. The patient reports that there is not domestic violence in her life.  Current complaints: 1.  Her hormones are working well for her, takes Prempro  and Prometrium 200mg  2. Need refill on hormones    Gynecologic History No LMP recorded. Patient has had an ablation. Contraception: none Last Pap: 03/01/22. Results were: normal Last mammogram: 11/06/21. Results were: normal Last Colonoscopy: pcp Last Dexa Scan: n/a   Obstetric History OB History  Gravida Para Term Preterm AB Living  2 2 2   2   SAB IAB Ectopic Multiple Live Births      2    # Outcome Date GA Lbr Len/2nd Weight Sex Type Anes PTL Lv  2 Term     F Vag-Spont   LIV  1 Term     F Vag-Spont   LIV    Past Medical History:  Diagnosis Date   Degenerative cervical spinal stenosis    occupational, hari dresser   Fibromyalgia     Family History  Problem Relation Age of Onset   Hypertension Mother    Hypertension Father    Skin cancer Father    Cancer Sister        breast   Breast cancer Sister 36    Past Surgical History:  Procedure Laterality Date   ablation     BREAST BIOPSY Right 2010?   neg   BREAST BIOPSY Left 2015   neg fibroadenoma    Social History   Socioeconomic History   Marital status: Married    Spouse name: Not on file   Number of children: Not on file   Years of education: Not on file   Highest education level: Not on file  Occupational History   Occupation: hairdresser  Tobacco Use   Smoking status: Former    Current packs/day: 0.00    Types: Cigarettes     Start date: 06/11/1996    Quit date: 06/12/2011    Years since quitting: 12.0   Smokeless tobacco: Never  Vaping Use   Vaping status: Never Used  Substance and Sexual Activity   Alcohol use: No   Drug use: No   Sexual activity: Yes    Birth control/protection: None  Other Topics Concern   Not on file  Social History Narrative   Not on file   Social Drivers of Health   Financial Resource Strain: Low Risk  (05/22/2023)   Received from [facility]   Overall Financial Resource Strain (CARDIA)    Difficulty of Paying Living Expenses: Not very hard  Food Insecurity: No Food Insecurity (05/22/2023)   Received from [facility]   Hunger Vital Sign    Worried About Running Out of Food in the Last Year: Never true    Ran Out of Food in the Last Year: Never true  Transportation Needs: No Transportation Needs (05/22/2023)   Received from [facility] - Transportation    In the past 12 months, has lack of transportation kept you from medical appointments or from getting medications?: No    Lack of  Transportation (Non-Medical): No  Physical Activity: Not on file  Stress: Not on file  Social Connections: Not on file  Intimate Partner Violence: Not on file    Current Outpatient Medications on File Prior to Visit  Medication Sig Dispense Refill   Ascorbic Acid (VITAMIN C) 100 MG tablet Take 100 mg by mouth daily.     buPROPion (WELLBUTRIN XL) 150 MG 24 hr tablet Take 1 tablet by mouth daily.     Cranberry Extract 250 MG TABS Take 1 tablet by mouth 2 (two) times daily at 10 AM and 5 PM. 180 each 2   cyclobenzaprine (FLEXERIL) 5 MG tablet Take 5 mg by mouth 3 (three) times daily as needed for muscle spasms.     escitalopram (LEXAPRO) 20 MG tablet      lactobacillus acidophilus (BACID) TABS tablet Take 2 tablets by mouth 2 (two) times daily.     Multiple Vitamin (MULTI-VITAMINS) TABS Take by mouth.     omega-3 acid ethyl esters (LOVAZA)  1 g capsule Take by mouth 2 (two) times daily.     SUMAtriptan (IMITREX) 100 MG tablet Take 100 mg by mouth every 2 (two) hours as needed for migraine. May repeat in 2 hours if headache persists or recurs.     traMADol  (ULTRAM ) 50 MG tablet Take 1 tablet (50 mg total) by mouth every 6 (six) hours as needed. 90 tablet 5   EPINEPHrine  0.3 mg/0.3 mL IJ SOAJ injection Inject 0.3 mg into the muscle as needed for anaphylaxis. (Patient not taking: Reported on 07/01/2023) 1 each 0   nortriptyline (PAMELOR) 10 MG capsule  (Patient not taking: Reported on 07/01/2023)  11   No current facility-administered medications on file prior to visit.    Allergies  Allergen Reactions   Vicodin [Hydrocodone-Acetaminophen ]    Sulfa Antibiotics Rash     Review of Systems ROS Review of Systems - General ROS: negative for - chills, fatigue, fever, hot flashes, night sweats, weight gain or weight loss Psychological ROS: negative for - anxiety, decreased libido, depression, mood swings, physical abuse or sexual abuse Ophthalmic ROS: negative for - blurry vision, eye pain or loss of vision ENT ROS: negative for - headaches, hearing change, visual changes or vocal changes Allergy and Immunology ROS: negative for - hives, itchy/watery eyes or seasonal allergies Hematological and Lymphatic ROS: negative for - bleeding problems, bruising, swollen lymph nodes or weight loss Endocrine ROS: negative for - galactorrhea, hair pattern changes, hot flashes, malaise/lethargy, mood swings, palpitations, polydipsia/polyuria, skin changes, temperature intolerance or unexpected weight changes Breast ROS: negative for - new or changing breast lumps or nipple discharge Respiratory ROS: negative for - cough or shortness of breath Cardiovascular ROS: negative for - chest pain, irregular heartbeat, palpitations or shortness of breath Gastrointestinal ROS: no abdominal pain, change in bowel habits, or black or bloody stools Genito-Urinary  ROS: no dysuria, trouble voiding, or hematuria Musculoskeletal ROS: negative for - joint pain or joint stiffness Neurological ROS: negative for - bowel and bladder control changes Dermatological ROS: negative for rash and skin lesion changes   OBJECTIVE:   BP 123/64   Pulse 83   Ht 5\' 7"  (1.702 m)   Wt 149 lb (67.6 kg)   BMI 23.34 kg/m  CONSTITUTIONAL: Well-developed, well-nourished female in no acute distress.  PSYCHIATRIC: Normal mood and affect. Normal behavior. Normal judgment and thought content. NEUROLGIC: Alert and oriented to person, place, and time. Normal muscle tone coordination. No cranial nerve deficit noted. HENT:  Normocephalic, atraumatic,  External right and left ear normal. Oropharynx is clear and moist EYES: Conjunctivae and EOM are normal. Pupils are equal, round, and reactive to light. No scleral icterus.  NECK: Normal range of motion, supple, no masses.  Normal thyroid .  SKIN: Skin is warm and dry. No rash noted. Not diaphoretic. No erythema. No pallor. CARDIOVASCULAR: Normal heart rate noted, regular rhythm, no murmur. RESPIRATORY: Clear to auscultation bilaterally. Effort and breath sounds normal, no problems with respiration noted. BREASTS: Symmetric in size. No masses, skin changes, nipple drainage, or lymphadenopathy. ABDOMEN: Soft, normal bowel sounds, no distention noted.  No tenderness, rebound or guarding.  BLADDER: Normal PELVIC:  Bladder no bladder distension noted  Urethra: normal appearing urethra with no masses, tenderness or lesions  Vulva: normal appearing vulva with no masses, tenderness or lesions  Vagina: normal appearing vagina with normal color and discharge, no lesions  Cervix: normal appearing cervix without discharge or lesions  Uterus: uterus is normal size, shape, consistency and nontender  Adnexa: normal adnexa in size, nontender and no masses  RV: External Exam NormaI, No Rectal Masses, and Normal Sphincter tone  MUSCULOSKELETAL:  Normal range of motion. No tenderness.  No cyanosis, clubbing, or edema.  2+ distal pulses. LYMPHATIC: No Axillary, Supraclavicular, or Inguinal Adenopathy.  Labs: Lab Results  Component Value Date   WBC 7.9 08/06/2018   HGB 13.5 08/06/2018   HCT 38.7 08/06/2018   MCV 94 08/06/2018   PLT 304 08/06/2018    Lab Results  Component Value Date   CREATININE 0.92 08/06/2018   BUN 17 08/06/2018   NA 142 08/06/2018   K 4.5 08/06/2018   CL 102 08/06/2018   CO2 24 08/06/2018    Lab Results  Component Value Date   ALT 12 08/06/2018   AST 19 08/06/2018   ALKPHOS 81 08/06/2018   BILITOT 0.4 08/06/2018    Lab Results  Component Value Date   CHOL 175 08/06/2018   HDL 65 08/06/2018   LDLCALC 89 08/06/2018   TRIG 105 08/06/2018   CHOLHDL 2.7 08/06/2018    Lab Results  Component Value Date   TSH 2.700 08/06/2018    Lab Results  Component Value Date   HGBA1C 5.0 08/06/2018     ASSESSMENT:   1. Well woman exam with routine gynecological exam   2. Breast cancer screening by mammogram   3. Vasomotor symptoms due to menopause   4. 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 G47P2002 female here today for her annual exam, doing well.  Pap: due 02/2027 Mammogram: ordered Colon: PCP  Labs: PCP Healthy lifestyle modifications discussed: multivitamin, diet, exercise, sunscreen, tobacco and alcohol use. Emphasized importance of regular physical activity.  Calcium and Vit D recommendation reviewed.   Menopausal symptoms on HRT: pt initially with severe vasomotor symptoms, started HRT 2019-2020. Stable on Prempro  and Prometrium, but is very drowsy when she takes the Prometrium, currently at daily dose of 200mg .  -Refilled Prempro  and Prometrium, but at 100mg  dose. We discussed she does not really need the Prometrium if taking the Prempro , and due to drowsiness, she would like to do a two-week trial off the Prometrium. Warned of  potential withdrawal bleeding. If she would like to restart the Prometrium, it has been refilled at the 100mg  dose. She will MyChart message us  if she restarts.   All questions answered to patient's satisfaction.  -RTC 1 yr for annual, sooner prn.  Sofia Dunn, DO [facility]

## 2023-07-01 ENCOUNTER — Encounter: Payer: Self-pay | Admitting: Obstetrics

## 2023-07-01 ENCOUNTER — Ambulatory Visit (INDEPENDENT_AMBULATORY_CARE_PROVIDER_SITE_OTHER): Admitting: Obstetrics

## 2023-07-01 VITALS — BP 123/64 | HR 83 | Ht 67.0 in | Wt 149.0 lb

## 2023-07-01 DIAGNOSIS — Z1231 Encounter for screening mammogram for malignant neoplasm of breast: Secondary | ICD-10-CM

## 2023-07-01 DIAGNOSIS — Z01419 Encounter for gynecological examination (general) (routine) without abnormal findings: Secondary | ICD-10-CM | POA: Diagnosis not present

## 2023-07-01 DIAGNOSIS — N951 Menopausal and female climacteric states: Secondary | ICD-10-CM | POA: Insufficient documentation

## 2023-07-01 MED ORDER — PREMPRO 0.3-1.5 MG PO TABS: 1.0000 | ORAL_TABLET | Freq: Every day | ORAL | 3 refills | Status: DC

## 2023-07-01 MED ORDER — PROGESTERONE MICRONIZED 100 MG PO CAPS: 100.0000 mg | ORAL_CAPSULE | Freq: Every day | ORAL | 3 refills | Status: AC

## 2023-07-01 NOTE — Patient Instructions (Signed)
 Preventive Care 16-57 Years Old, Female  Preventive care refers to lifestyle choices and visits with your health care provider that can promote health and wellness. Preventive care visits are also called wellness exams.  What can I expect for my preventive care visit?  Counseling  Your health care provider may ask you questions about your:  Medical history, including:  Past medical problems.  Family medical history.  Pregnancy history.  Current health, including:  Menstrual cycle.  Method of birth control.  Emotional well-being.  Home life and relationship well-being.  Sexual activity and sexual health.  Lifestyle, including:  Alcohol, nicotine or tobacco, and drug use.  Access to firearms.  Diet, exercise, and sleep habits.  Work and work Astronomer.  Sunscreen use.  Safety issues such as seatbelt and bike helmet use.  Physical exam  Your health care provider will check your:  Height and weight. These may be used to calculate your BMI (body mass index). BMI is a measurement that tells if you are at a healthy weight.  Waist circumference. This measures the distance around your waistline. This measurement also tells if you are at a healthy weight and may help predict your risk of certain diseases, such as type 2 diabetes and high blood pressure.  Heart rate and blood pressure.  Body temperature.  Skin for abnormal spots.  What immunizations do I need?    Vaccines are usually given at various ages, according to a schedule. Your health care provider will recommend vaccines for you based on your age, medical history, and lifestyle or other factors, such as travel or where you work.  What tests do I need?  Screening  Your health care provider may recommend screening tests for certain conditions. This may include:  Lipid and cholesterol levels.  Diabetes screening. This is done by checking your blood sugar (glucose) after you have not eaten for a while (fasting).  Pelvic exam and Pap test.  Hepatitis B test.  Hepatitis C  test.  HIV (human immunodeficiency virus) test.  STI (sexually transmitted infection) testing, if you are at risk.  Lung cancer screening.  Colorectal cancer screening.  Mammogram. Talk with your health care provider about when you should start having regular mammograms. This may depend on whether you have a family history of breast cancer.  BRCA-related cancer screening. This may be done if you have a family history of breast, ovarian, tubal, or peritoneal cancers.  Bone density scan. This is done to screen for osteoporosis.  Talk with your health care provider about your test results, treatment options, and if necessary, the need for more tests.  Follow these instructions at home:  Eating and drinking    Eat a diet that includes fresh fruits and vegetables, whole grains, lean protein, and low-fat dairy products.  Take vitamin and mineral supplements as recommended by your health care provider.  Do not drink alcohol if:  Your health care provider tells you not to drink.  You are pregnant, may be pregnant, or are planning to become pregnant.  If you drink alcohol:  Limit how much you have to 0-1 drink a day.  Know how much alcohol is in your drink. In the [place] one drink equals one 12 oz bottle of beer (355 mL), one 5 oz glass of wine (148 mL), or one 1 oz glass of hard liquor (44 mL).  Lifestyle  Brush your teeth every morning and night with fluoride toothpaste. Floss one time each day.  Exercise for at least  30 minutes 5 or more days each week.  Do not use any products that contain nicotine or tobacco. These products include cigarettes, chewing tobacco, and vaping devices, such as e-cigarettes. If you need help quitting, ask your health care provider.  Do not use drugs.  If you are sexually active, practice safe sex. Use a condom or other form of protection to prevent STIs.  If you do not wish to become pregnant, use a form of birth control. If you plan to become pregnant, see your health care provider for a  prepregnancy visit.  Take aspirin only as told by your health care provider. Make sure that you understand how much to take and what form to take. Work with your health care provider to find out whether it is safe and beneficial for you to take aspirin daily.  Find healthy ways to manage stress, such as:  Meditation, yoga, or listening to music.  Journaling.  Talking to a trusted person.  Spending time with friends and family.  Minimize exposure to UV radiation to reduce your risk of skin cancer.  Safety  Always wear your seat belt while driving or riding in a vehicle.  Do not drive:  If you have been drinking alcohol. Do not ride with someone who has been drinking.  When you are tired or distracted.  While texting.  If you have been using any mind-altering substances or drugs.  Wear a helmet and other protective equipment during sports activities.  If you have firearms in your house, make sure you follow all gun safety procedures.  Seek help if you have been physically or sexually abused.  What's next?  Visit your health care provider once a year for an annual wellness visit.  Ask your health care provider how often you should have your eyes and teeth checked.  Stay up to date on all vaccines.  This information is not intended to replace advice given to you by your health care provider. Make sure you discuss any questions you have with your health care provider.  Document Revised: 07/05/2020 Document Reviewed: 07/05/2020  Elsevier Patient Education  2024 [employer].

## 2023-12-23 ENCOUNTER — Other Ambulatory Visit: Payer: Self-pay | Admitting: Physical Medicine and Rehabilitation

## 2023-12-23 DIAGNOSIS — M5412 Radiculopathy, cervical region: Secondary | ICD-10-CM

## 2024-01-05 ENCOUNTER — Inpatient Hospital Stay
Admission: RE | Admit: 2024-01-05 | Discharge: 2024-01-05 | Attending: Physical Medicine and Rehabilitation | Admitting: Physical Medicine and Rehabilitation

## 2024-01-05 DIAGNOSIS — M5412 Radiculopathy, cervical region: Secondary | ICD-10-CM

## 2024-01-13 ENCOUNTER — Other Ambulatory Visit: Payer: Self-pay

## 2024-01-13 ENCOUNTER — Other Ambulatory Visit: Payer: Self-pay | Admitting: Certified Nurse Midwife

## 2024-01-13 DIAGNOSIS — N951 Menopausal and female climacteric states: Secondary | ICD-10-CM

## 2024-01-13 DIAGNOSIS — Z01419 Encounter for gynecological examination (general) (routine) without abnormal findings: Secondary | ICD-10-CM

## 2024-01-13 MED ORDER — PREMPRO 0.3-1.5 MG PO TABS: 1.0000 | ORAL_TABLET | Freq: Every day | ORAL | 0 refills | Status: AC

## 2024-01-13 NOTE — Progress Notes (Signed)
 Patient called stating she is unable to obtain her refill through home delivery pharmacy until the 27th and is currently out of medication as of today. Patient requested a partial fill be sent to [facility] to cover until delivery. Partial prescription sent to pharmacy.

## 2024-01-16 NOTE — Telephone Encounter (Signed)
 Date: 01/13/2024 Department: [facility] Ordering/Authorizing: Sebastian Sham, CNM   Order Providers  Prescribing Provider Encounter Provider  Sebastian Sham, CNM Millicent Mathis CROME, CMA   Outpatient Medication Detail   Disp Refills Start End   estrogen, conjugated,-medroxyprogesterone (PREMPRO ) 0.3-1.5 MG tablet 5 tablet 0 01/13/2024 --   Sig - Route: Take 1 tablet by mouth daily. - Oral   Sent to pharmacy as: estrogen, conjugated,-medroxyprogesterone (PREMPRO ) 0.3-1.5 MG tablet   E-Prescribing Status: Receipt confirmed by pharmacy (01/13/2024  5:12 PM EST)

## 2024-01-16 NOTE — Telephone Encounter (Signed)
 SABRA
